# Patient Record
Sex: Female | Born: 1960 | Race: White | Hispanic: No | Marital: Married | Smoking: Never smoker
Health system: Southern US, Community
[De-identification: ages and names within clinical notes are randomized; demographics above are authoritative.]

## PROBLEM LIST (undated history)

## (undated) DIAGNOSIS — D649 Anemia, unspecified: Secondary | ICD-10-CM

## (undated) DIAGNOSIS — T7840XA Allergy, unspecified, initial encounter: Secondary | ICD-10-CM

## (undated) DIAGNOSIS — M1990 Unspecified osteoarthritis, unspecified site: Secondary | ICD-10-CM

## (undated) HISTORY — PX: COLONOSCOPY: SHX174

## (undated) HISTORY — PX: WISDOM TOOTH EXTRACTION: SHX21

## (undated) HISTORY — DX: Unspecified osteoarthritis, unspecified site: M19.90

## (undated) HISTORY — DX: Allergy, unspecified, initial encounter: T78.40XA

## (undated) HISTORY — DX: Anemia, unspecified: D64.9

---

## 1998-01-19 ENCOUNTER — Ambulatory Visit (HOSPITAL_COMMUNITY): Admission: RE | Admit: 1998-01-19 | Discharge: 1998-01-19 | Payer: Self-pay | Admitting: Obstetrics and Gynecology

## 1998-03-11 ENCOUNTER — Inpatient Hospital Stay (HOSPITAL_COMMUNITY): Admission: AD | Admit: 1998-03-11 | Discharge: 1998-03-11 | Payer: Self-pay | Admitting: Obstetrics & Gynecology

## 1998-03-30 ENCOUNTER — Other Ambulatory Visit: Admission: RE | Admit: 1998-03-30 | Discharge: 1998-03-30 | Payer: Self-pay | Admitting: Obstetrics and Gynecology

## 1998-04-14 ENCOUNTER — Inpatient Hospital Stay (HOSPITAL_COMMUNITY): Admission: AD | Admit: 1998-04-14 | Discharge: 1998-04-14 | Payer: Self-pay | Admitting: Obstetrics and Gynecology

## 1998-05-26 ENCOUNTER — Other Ambulatory Visit: Admission: RE | Admit: 1998-05-26 | Discharge: 1998-05-26 | Payer: Self-pay | Admitting: Obstetrics and Gynecology

## 1998-06-26 ENCOUNTER — Inpatient Hospital Stay (HOSPITAL_COMMUNITY): Admission: AD | Admit: 1998-06-26 | Discharge: 1998-06-28 | Payer: Self-pay | Admitting: *Deleted

## 2000-04-16 ENCOUNTER — Other Ambulatory Visit: Admission: RE | Admit: 2000-04-16 | Discharge: 2000-04-16 | Payer: Self-pay | Admitting: Obstetrics and Gynecology

## 2000-05-26 ENCOUNTER — Ambulatory Visit (HOSPITAL_COMMUNITY): Admission: RE | Admit: 2000-05-26 | Discharge: 2000-05-26 | Payer: Self-pay | Admitting: Obstetrics and Gynecology

## 2000-05-26 ENCOUNTER — Encounter: Payer: Self-pay | Admitting: Obstetrics and Gynecology

## 2000-07-25 ENCOUNTER — Encounter: Payer: Self-pay | Admitting: Obstetrics & Gynecology

## 2000-07-25 ENCOUNTER — Ambulatory Visit (HOSPITAL_COMMUNITY): Admission: RE | Admit: 2000-07-25 | Discharge: 2000-07-25 | Payer: Self-pay | Admitting: Obstetrics & Gynecology

## 2000-08-06 ENCOUNTER — Inpatient Hospital Stay (HOSPITAL_COMMUNITY): Admission: AD | Admit: 2000-08-06 | Discharge: 2000-08-06 | Payer: Self-pay | Admitting: Obstetrics & Gynecology

## 2000-09-11 ENCOUNTER — Encounter: Payer: Self-pay | Admitting: Obstetrics and Gynecology

## 2000-09-11 ENCOUNTER — Ambulatory Visit (HOSPITAL_COMMUNITY): Admission: RE | Admit: 2000-09-11 | Discharge: 2000-09-11 | Payer: Self-pay | Admitting: Obstetrics and Gynecology

## 2000-11-02 ENCOUNTER — Inpatient Hospital Stay (HOSPITAL_COMMUNITY): Admission: AD | Admit: 2000-11-02 | Discharge: 2000-11-02 | Payer: Self-pay | Admitting: *Deleted

## 2000-11-03 ENCOUNTER — Inpatient Hospital Stay (HOSPITAL_COMMUNITY): Admission: AD | Admit: 2000-11-03 | Discharge: 2000-11-05 | Payer: Self-pay | Admitting: Obstetrics & Gynecology

## 2001-09-23 ENCOUNTER — Other Ambulatory Visit: Admission: RE | Admit: 2001-09-23 | Discharge: 2001-09-23 | Payer: Self-pay | Admitting: Obstetrics and Gynecology

## 2001-10-28 ENCOUNTER — Ambulatory Visit (HOSPITAL_COMMUNITY): Admission: AD | Admit: 2001-10-28 | Discharge: 2001-10-28 | Payer: Self-pay | Admitting: Obstetrics and Gynecology

## 2001-10-28 ENCOUNTER — Encounter (INDEPENDENT_AMBULATORY_CARE_PROVIDER_SITE_OTHER): Payer: Self-pay

## 2002-08-11 ENCOUNTER — Encounter: Payer: Self-pay | Admitting: Obstetrics and Gynecology

## 2002-08-11 ENCOUNTER — Ambulatory Visit (HOSPITAL_COMMUNITY): Admission: RE | Admit: 2002-08-11 | Discharge: 2002-08-11 | Payer: Self-pay | Admitting: Obstetrics and Gynecology

## 2002-10-25 ENCOUNTER — Inpatient Hospital Stay (HOSPITAL_COMMUNITY): Admission: AD | Admit: 2002-10-25 | Discharge: 2002-10-25 | Payer: Self-pay | Admitting: Obstetrics and Gynecology

## 2002-12-30 ENCOUNTER — Ambulatory Visit (HOSPITAL_COMMUNITY): Admission: RE | Admit: 2002-12-30 | Discharge: 2002-12-30 | Payer: Self-pay | Admitting: Obstetrics and Gynecology

## 2002-12-30 ENCOUNTER — Encounter: Payer: Self-pay | Admitting: Obstetrics and Gynecology

## 2003-01-12 ENCOUNTER — Inpatient Hospital Stay (HOSPITAL_COMMUNITY): Admission: AD | Admit: 2003-01-12 | Discharge: 2003-01-15 | Payer: Self-pay | Admitting: Obstetrics and Gynecology

## 2003-02-24 ENCOUNTER — Other Ambulatory Visit: Admission: RE | Admit: 2003-02-24 | Discharge: 2003-02-24 | Payer: Self-pay | Admitting: *Deleted

## 2008-02-10 ENCOUNTER — Ambulatory Visit (HOSPITAL_COMMUNITY): Admission: RE | Admit: 2008-02-10 | Discharge: 2008-02-10 | Payer: Self-pay | Admitting: Family Medicine

## 2008-03-08 ENCOUNTER — Encounter: Admission: RE | Admit: 2008-03-08 | Discharge: 2008-03-08 | Payer: Self-pay | Admitting: Family Medicine

## 2011-04-19 NOTE — H&P (Signed)
[facility]  Patient:    Vanessa Nixon, Vanessa Nixon                       MRN: [id number] Adm. Date:  [id number] Attending:  Cleatrice Burke Dictator:   Wynelle Bourgeois, C.N.M.                         History and Physical  HISTORY OF PRESENT ILLNESS:   The patient is a 50 year old gravida 5, para 3-0-1-3, at 40-4/7 weeks who presented to the MAU this morning with complaints of increased uterine contractions and rupture of membranes at 0245.  She was previously seen in the MAU earlier in the evening, and discharged home at 11 p.m. with a cervix of 3 cm, and irregular contractions, and a reactive fetal heart tracing.  Shortly, after admission to MAU, the patient was transferred to the birthing suite where she quickly delivered vaginally of a female infant, weighing 7 pounds and 5 ounces.  Apgars 8 and 8.  Her pregnancy has been followed by the nurse midwifery service at [facility], and has been remarkable for:  1. History of irritable bowel syndrome. 2. Advanced maternal age. 3. History of postpartum depression. 4. Rh negative. 5. GBS negative.  OB HISTORY:                   Remarkable for a vacuum-assisted vaginal delivery in April 1995 of a female infant at [redacted] weeks gestation, weighing 6 pounds and 12 ounces.  She had a vaginal delivery in March of 1997, of a female infant at [redacted] weeks gestation, weighing 7 pounds and 6 ounces, and she had a spontaneous abortion in June of 1998, at 7-[redacted] weeks gestation with no complications.  She had a vacuum-assisted vaginal delivery in July of 1999, of a female infant at [redacted] weeks gestation, weighing 7 pounds and 9 ounces.  Two of these pregnancies were complicated by hip malalignment in the mother with prolonged numbness in the right foot after the third delivery.  PRENATAL LABORATORY DATA:     Hemoglobin 10.8, hematocrit 34.5, platelets 256. Blood type O-negative.  Antibody screen negative.  RPR nonreactive.   Rubella immune.  HBsAG negative.  Pap test normal.  Gonorrhea negative.  Chlamydia negative.  AFB free beta declined.  Glucose challenge 146.  Three-hour GGT had values of 76, 96, 125, and 108.  PAST MEDICAL HISTORY:         Remarkable for postpartum depression x 2 weeks after one pregnancy, occasional yeast infections, childhood rubella and Varicella, and a remote history of CMV at age 28.  She also has a history of anemia since 13 years, and history of chronic constipation and chronic gastritis.  PAST SURGICAL HISTORY:        Remarkable for child birth and wisdom tooth extraction in 1992.  FAMILY HISTORY:               Remarkable for a mother with varicosities, paternal aunt with colon cancer, sister with breast cancer, maternal grandmother with brain cancer.  GENETIC HISTORY:              Remarkable for the patients age being 30; amnio was declined.  SOCIAL HISTORY:               The patient is married to [employer] who is involved and supportive.  They are of the [place] faith.  She denies any  alcohol, tobacco, or drug use.  PHYSICAL EXAMINATION:  VITAL SIGNS:                  Stable.  She is afebrile.  HEENT:                        Within normal limits.  Thyroid normal and not enlarged.  BREASTS:                      Soft and nontender, no masses.  CHEST:                        Clear to auscultation bilaterally.  HEART:                        Regular rate and rhythm.  ABDOMEN:                      Gravid at 4 cm, vertex to Leop[place].  Fetal heart rate 140s with positive accelerations.  No decelerations until about the time of delivery in which she had variable decelerations in the 120s.  CERVICAL EXAMINATION:         On admission, the anterior lip completely dilated to 0 station with a vertex presentation.  Clear fluid leaking.  EXTREMITIES:                  Within normal limits.  ASSESSMENT:                   1. Intrauterine pregnancy at 40-4/7 weeks.                                2. Group B streptococcus negative.                               3. Status post spontaneous vaginal delivery of a                                  female infant weighing 7 pounds and 5 ounces.                                  Apgars 8 and 8.                               4. Small periclitoral split with a small                                  perineal abrasion.  PLAN:                         1. Admit to birthing suites was effective.                               2. Routine CNM care.  3. Routine delivery care.                               4. Routine postpartum orders. DD:  11/03/00 TD:  11/03/00 Job: [id number]

## 2011-04-19 NOTE — Op Note (Signed)
[facility]  Patient:    Vanessa Nixon, Vanessa Nixon Visit Number: [id number] MRN: [id number]          Service Type: DSU Location: [facility] Attending Physician:  Shaune Spittle Dictated by:   Maris Berger. Pennie Rushing, M.D. Proc. Date: 10/28/01 Admit Date:  10/28/2001                             Operative Report  PREOPERATIVE DIAGNOSIS:       Intrauterine fetal demise at 11 weeks,                               missed abortion.  POSTOPERATIVE DIAGNOSIS:      Intrauterine fetal demise at 11 weeks,                               missed abortion.  OPERATION:                    Suction dilatation and evacuation.  SURGEON:                      Vanessa P. Pennie Rushing, M.D.  ANESTHESIA:                   General orotracheal.  ESTIMATED BLOOD LOSS:         Less than 100 cc.  COMPLICATIONS:                None.  FINDINGS:                     Products of conception, large amount.  DESCRIPTION OF PROCEDURE:     The patient was taken to the operating room after appropriate identification and placed on the operating table.  After achievement of adequate general anesthesia, she was placed in the lithotomy position.  The perineum and vagina were prepped with multiple layers of Betadine and draped as a sterile field.  A red Robinson catheter was used to empty the bladder.  A Graves speculum was placed in the posterior vagina and single-tooth tenaculum placed on the anterior cervix.  A paracervical block was achieved with a total of 10 cc of 2% Xylocaine.  The cervix was noted to be dilated to accommodate a #12 suction catheter.  This was used to suction evacuate the contents of the uterus and a sharp curet used to insure that all products of conception had been removed.  All instruments were then removed from the vagina and the patient awakened from general anesthesia and taken to the recovery room in satisfactory condition having tolerated the procedure well with sponge  and instrument counts correct.  Specimens to pathology: Products of conception.  Postoperative instructions:  Printed instructions from [facility].  Discharge medications:  Ibuprofen 600 mg p.o. q.6h. p.r.n. pain, over-the-counter.  Methergine 0.2 mg p.o. q.6h. for eight doses.  Doxycycline 100 mg p.o. b.i.d. for five days.  Blood type is O negative and the patient is given RhoGAM.  Follow-up instructions in two weeks with Dr. Pennie Rushing. Dictated by:   Maris Berger. Pennie Rushing, M.D. Attending Physician:  Shaune Spittle DD:  10/28/01 TD:  10/28/01 Job: [id number]

## 2011-04-19 NOTE — Op Note (Signed)
NAME:  Vanessa Nixon, Vanessa Nixon                          ACCOUNT NO.:  [id number]   MEDICAL RECORD NO.:  [id number]                   PATIENT TYPE:  INP   LOCATION:  9198                                 FACILITY:  [facility]   PHYSICIAN:  Hal Morales, M.D.             DATE OF BIRTH:  May 27, 1961   DATE OF PROCEDURE:  01/12/2003  DATE OF DISCHARGE:                                 OPERATIVE REPORT   PREOPERATIVE DIAGNOSES:  1. Intrauterine pregnancy at term.  2. Abnormal and unstable fetal lie.   POSTOPERATIVE DIAGNOSIS:  Term intrauterine pregnancy with transverse lie.   PROCEDURE:  Primary low transverse cesarean section.   SURGEON:  Hal Morales, M.D.   ASSISTANT:  Rica Koyanagi, C.N.M.   ANESTHESIA:  Spinal and local for incision closure.   ESTIMATED BLOOD LOSS:  1000 mL.   COMPLICATIONS:  None.   FINDINGS:  The uterus, tubes, and ovaries were normal for the gravid state.  The patient was delivered of a female infant whose name is Vicente Serene, weighing 8  pounds 3 ounces, with Apgars of 8 and 9 at one and five minutes,  respectively.   DESCRIPTION OF PROCEDURE:  The patient was taken to the operating room after  appropriate identification and placed on the operating table.  A limited  ultrasound was performed in the preoperative area showing the fetal vertex  in the right upper quadrant.  A spinal anesthetic was undertaken in the  operating room, and the patient was placed in the supine position with a  left lateral tilt.  The abdomen and perineum were prepped with multiple  layers of Betadine and a Foley catheter inserted into the bladder and  connected to straight drainage.  The abdomen was draped as a sterile field.  After assurance of adequate anesthesia, a transverse incision was made in  the abdomen nondistended the abdomen opened in layers.  The peritoneum was  opened in layers.  The uterus was incised approximately 2 cm above the  uterovesical fold and the incision  taken laterally on either side bluntly.  The infant was rotated into the frank breech position and delivered as a  frank breech.  After having the nares and pharynx suctioned and the cord  clamped and cut, he was handed off to the awaiting pediatricians.  The  appropriate cord blood was drawn and the placenta noted to have separated  from the uterus and was removed from the operative field.  The uterine  incision was closed with a running interlocking suture of 0 Vicryl.  An  imbricating suture of 0 Vicryl was then placed.  Hemostasis was achieved  with a single figure-of-eight suture in the incision line and copious  irrigation carried out.  The abdominal peritoneum was closed with a running  suture of 2-0 Vicryl.  The rectus muscles were reapproximated in the midline  with a figure-of-eight suture of 2-0 Vicryl.  The rectus fascia was closed  with a running suture of 0 Vicryl, then reinforced on either side of midline  with figure-of-eight sutures of 0 Vicryl.  The subcutaneous tissue was  irrigated and made hemostatic with Bovie cautery, then infiltrated with a  total of 10 mL of  0.25% Marcaine.  Skin staples were applied to the skin incision.  A sterile  dressing was applied and the patient taken from the operating room to the  recovery room in satisfactory condition, having tolerated the procedure  well, with sponge and instrument counts correct.  The infant went to the  full-term nursery.                                                Hal Morales, M.D.    VPH/MEDQ  D:  01/12/2003  T:  01/12/2003  Job:  [id number]

## 2011-04-19 NOTE — Discharge Summary (Signed)
NAME:  Vanessa Nixon, Vanessa Nixon                          ACCOUNT NO.:  [id number]   MEDICAL RECORD NO.:  [id number]                   PATIENT TYPE:  INP   LOCATION:  9144                                 [facility]ACILITY:  [facility]   PHYSICIAN:  Crist Fat. Rivard, M.D.              DATE OF BIRTH:  [birth year]   DATE OF ADMISSION:  01/12/2003  DATE OF DISCHARGE:  01/15/2003                                 DISCHARGE SUMMARY   ADMISSION DIAGNOSES:  1. Intrauterine pregnancy at term.  2. Abnormal and unstable fetal lie.   DISCHARGE DIAGNOSES:  1. Intrauterine pregnancy at term.  2. Abnormal and unstable fetal lie.   PROCEDURE:  Primary low transverse cesarean section, spinal and local for  incision closure.   HOSPITAL COURSE:  The patient is a 50 year old married white female gravida  7, para 4-0-2-4 who presents at [redacted] weeks gestation with a history over the  last few weeks of fetal malpresentation.  Ultrasound on December 30, 2002  found baby to be in footling breech presentation with a nuchal cord noted at  that time.  The patient spoke with Erie Noe P. Haygood, M.D. regarding the  risks and benefits of cesarean section as well as breech delivery.  Repeat  ultrasound on January 11, 2003 showed the baby in transverse lie and  cesarean section was scheduled for January 12, 2003.  Her pregnancy had  been followed by the Certified Nurse Midwife Service at [facility] and had been remarkable for:  (1) history of irritable bowel  syndrome, (2) history of depression, (3) Rh negative, (4) history of  sciatica pain lower thigh secondary pregnancy less than 12 months.  The  procedure went well with the delivery of a viable female infant named Gabriel  weighing 8 pounds 3 ounces with Apgars of 8 and 9 at one and five minutes  respectively.  Infant was taken to the full-term nursery in good condition.  The patient was taken to the recovery room and was doing well.  Infant was  delivered from a frank  breech position.  By postop day #1, the patient was  doing well.  Her hemoglobin was 7.2, it had been 10.5 preoperatively.  She  was asymptomatic and her vital signs were stable.  She planned condoms and  foam for contraception and then her husband is to have a vasectomy.  By  postop day #2, there were some issues with her pain medication.  Percocet  was ineffective and Motrin was causing GI upset.  She was started on  Darvocet, which by postop day #3 she reported as a good source of pain  relief.  By postop day #3, she continued to do well and was deemed to have  received the full benefit of her hospital stay and was discharged home.   DISCHARGE MEDICATIONS:  1. Darvocet-N 100 one to two p.o. q.3-4 h. p.r.n. pain.  2. Tylenol  650 mg p.o. q.4 h. p.r.n. pain.  3. Prenatal vitamins one p.o. daily.   DISCHARGE INSTRUCTIONS:  Per [facility] handout.   FOLLOW UP:  Discharge follow up will occur at [facility] in six  weeks postpartum.    Cam Hai, C.N.M.                     Crist Fat Rivard, M.D.   KS/MEDQ  D:  01/15/2003  T:  01/15/2003  Job:  [id number]

## 2011-04-19 NOTE — H&P (Signed)
NAMEGINIA, Vanessa Nixon                            ACCOUNT NO.:  [id number]   MEDICAL RECORD NO.:  [id number]                   PATIENT TYPE:   LOCATION:                                       FACILITY:   PHYSICIAN:  Hal Morales, M.D.             DATE OF BIRTH:  11-Dec-1960   DATE OF ADMISSION:  DATE OF DISCHARGE:                                HISTORY & PHYSICAL   HISTORY OF PRESENT ILLNESS:  Vanessa Nixon is a 50 year old Gravida VII, Para  IV, 0/2/4 with estimated date of delivery January 12, 2003 determined by 14  week, 0 day ultrasound and confirmed with follow-up ultrasound. The patient  is scheduled for a primary low transverse Cesarean section on January 12, 2003 for mal-presentation. Ultrasound performed on December 30, 2002 finds  baby to be in the footling breech presentation and also a nuchal cord is  noted at that time. As these are contraindications to external cephalic  version, this was not offered to the patient. The risks and benefits of  Cesarean section as well as breech delivery were discussed by Dr. Dierdre Forth with the patient and the patient consented to a scheduled primary  low transverse Cesarean section. She will have a repeat ultrasound on  January 11, 2003 and if baby remains breech, Cesarean section will be  planned for tomorrow. This patient's pregnancy has been followed by the CNM  Service at [facility]. It is remarkable for advanced maternal age. She declined  amniocentesis. She has a past history of irritable bowel syndrome, history  of depression, RH negative, history of sciatica pain, second pregnancy in  less than 12 months. The patient is group B strep negative.   OBSTETRIC HISTORY:  In 1995, the patient had a vacuum assisted vaginal  delivery at term with the birth of a 6 pound 12 ounce female infant named  Reuel Boom with no complications. In 1997, the patient had a normal spontaneous  vaginal delivery at term for a 7 pound 6 ounce female infant  with no  complications. The baby's name is Tobi Bastos. In 1998, the patient had a first  trimester spontaneous AB with no complications. In 1999, at term a vacuum  assisted vaginal birth for a 7 pound 9 ounce female infant named Surveyor, mining. The  patient experienced prolonged numbness in her right foot from the epidural  and postpartum depression following that pregnancy. In 2001 at term, the  patient had a normal spontaneous vaginal delivery with the birth of a 7  pound 5 ounce female infant and in 2002, the patient had a first trimester  missed AB with a D&E and the present pregnancy.   PAST MEDICAL HISTORY:  The patient's medical history is significant for  postpartum depression. She has a history of irritable bowel syndrome and had  her wisdom teeth removed in 1992. She also experiences periodic anemia.   FAMILY HISTORY:  The patient's mother and maternal aunt with varicose veins.  Paternal aunt history of colon cancer. Patient's sister with breast cancer  and mother with a brain tumor.   GENETIC HISTORY:  Significant for advanced maternal age, with patient being  50 years of age. Declined amniocentesis.   SOCIAL HISTORY:  Vanessa Nixon is a 50 year old married Caucasian female. Her  husband, Oakley Orban, is involved in supportive. They are [place] in  their faith. She denies the use of alcohol, tobacco, or illicit drugs.   ALLERGIES:  CODEINE (nausea and vomiting).   REVIEW OF SYSTEMS:  As described above. The patient is typical of one with a  uterine pregnancy at term.   PHYSICAL EXAMINATION:  VITAL SIGNS: Stable. Afebrile.  HEENT: Unremarkable.  HEART: Regular rate and rhythm.  LUNGS: Clear.  ABDOMEN: Gravid and is contour. Uterine fundus is noted to extend 38 cm  above the level of the pubic synthesis.  EXTREMITIES: No pathologic edema. Deep tendon reflexes are 1+ with no  clonus.   DIAGNOSTIC IMPRESSION:  Ultrasound examination on December 30, 2002 finds the  infant to be in a  footling breech position with the presence of a nuchal  cord. Amniotic fluid level at that time is normal. Fetal heart rate is  reassuring with Doppler at 140.   ASSESSMENT:  Intrauterine pregnancy at term. Breech presentation.   PLAN:  Admit for Dr. Dierdre Forth. Primary low transverse Cesarean  section for breech presentation.     Rica Koyanagi, C.N.M.               Hal Morales, M.D.    SDM/MEDQ  D:  01/11/2003  T:  01/11/2003  Job:  [id number]   cc:   Hal Morales, M.D.  [address], [address]  [place]  [place] [address]  Fax: [phone]

## 2013-04-12 ENCOUNTER — Other Ambulatory Visit: Payer: Self-pay | Admitting: Family Medicine

## 2013-04-12 ENCOUNTER — Other Ambulatory Visit (HOSPITAL_COMMUNITY)
Admission: RE | Admit: 2013-04-12 | Discharge: 2013-04-12 | Disposition: A | Discharge status: Home / Self Care | Payer: BC Managed Care – HMO | Source: Ambulatory Visit | Attending: Family Medicine | Admitting: Family Medicine

## 2013-04-12 DIAGNOSIS — Z124 Encounter for screening for malignant neoplasm of cervix: Secondary | ICD-10-CM | POA: Insufficient documentation

## 2016-01-29 ENCOUNTER — Ambulatory Visit (INDEPENDENT_AMBULATORY_CARE_PROVIDER_SITE_OTHER): Payer: 59 | Admitting: Family Medicine

## 2016-01-29 VITALS — BP 134/72 | HR 91 | Temp 98.5°F | Resp 16 | Ht 61.0 in | Wt 141.0 lb

## 2016-01-29 DIAGNOSIS — G4700 Insomnia, unspecified: Secondary | ICD-10-CM

## 2016-01-29 DIAGNOSIS — L853 Xerosis cutis: Secondary | ICD-10-CM

## 2016-01-29 DIAGNOSIS — L219 Seborrheic dermatitis, unspecified: Secondary | ICD-10-CM

## 2016-01-29 MED ORDER — FLUOCINOLONE ACETONIDE 0.01 % EX SHAM: MEDICATED_SHAMPOO | CUTANEOUS | Status: DC

## 2016-01-29 NOTE — Patient Instructions (Signed)
Seborrheic Dermatitis Seborrheic dermatitis involves pink or red skin with greasy, flaky scales. It usually occurs on the scalp, and it is often called dandruff. This condition may also affect the eyebrows, nose, ears, chest, and the bearded area of men's faces. It often occurs where skin has more oil (sebaceous) glands. It may come and go for no known reason, and it is often long-lasting (chronic). CAUSES The cause is not known. RISK FACTORS This condition is more like to develop in:  People who are stressed or tired.  People who have skin conditions, such as acne.  People who have certain conditions, such as:  HIV (human immunodeficiency virus).  AIDS (acquired immunodeficiency syndrome).  Parkinson disease.  An eating disorder.  Stroke.  Depression.  Epilepsy.  Alcoholism.  People who live in places that have extreme weather.  People who have a family history of seborrheic dermatitis.  People who use skin creams that are made with alcohol.  People who are 30-60 years old.  People who take certain medicines. SYMPTOMS Symptoms of this condition include:  Thick scales on the scalp.  Redness on the face or in the armpits.  Skin that is flaky. The flakes may be white or yellow.  Skin that seems oily or dry but is not helped with moisturizers.  Itching or burning in the affected areas. DIAGNOSIS This condition is diagnosed with a medical history and physical exam. A sample of your skin may be tested (skin biopsy). You may need to see a skin specialist (dermatologist). TREATMENT There is no cure for this condition, but treatment can help to manage the symptoms. Treatment may include:  Cortisone (steroid) ointments, creams, and lotions.  Over-the-counter or prescription shampoos. HOME CARE INSTRUCTIONS  Apply over-the-counter and prescription medicines only as told by your health care provider.  Keep all follow-up visits as told by your health care provider.  This is important.  Try to reduce your stress, such as with yoga or mediation. If you need help to reduce stress, ask your health care provider.  Shower or bathe as told by your health care provider.  Use any medicated shampoos as told by your health care provider. SEEK MEDICAL CARE IF:  Your symptoms do not improve with treatment.  Your symptoms get worse.  You have new symptoms.   This information is not intended to replace advice given to you by your health care provider. Make sure you discuss any questions you have with your health care provider.   Document Released: 11/18/2005 Document Revised: 08/09/2015 Document Reviewed: 04/05/2015 Elsevier Interactive Patient Education 2016 [employer]  

## 2016-01-29 NOTE — Progress Notes (Signed)
 Chief Complaint:  Chief Complaint  Patient presents with  . Insomnia  . scalp itching    HPI: Vanessa Nixon is a 55 y.o. female who reports to [facility] today complaining of  1-2 week history of scalp itching, no new meds.  History of eczema, sensitive skin, has not tried anything. . She has no pain or numbness.  No bumps or lumps , Just itches. . No  Hx of psoriasis. Daughter has eczema on her hands  She is still having her cycle. She is turning 55 and wants to know if that is noral for her to have regular cycles. Has difficulty sleeping. Falling and staying asleep.   No past medical history on file. No past surgical history on file. Social History   Social History  . Marital Status: Married    Spouse Name: N/A  . Number of Children: N/A  . Years of Education: N/A   Social History Main Topics  . Smoking status: Never Smoker   . Smokeless tobacco: None  . Alcohol Use: None  . Drug Use: None  . Sexual Activity: Not Asked   Other Topics Concern  . None   Social History Narrative  . None   No family history on file. Allergies  Allergen Reactions  . Codeine Itching   Prior to Admission medications   Medication Sig Start Date End Date Taking? Authorizing Provider  Multiple Vitamins-Minerals (MULTIVITAMIN WITH MINERALS) tablet Take 1 tablet by mouth daily.   Yes Historical Provider, MD     ROS: The patient denies fevers, chills, night sweats, unintentional weight loss, chest pain, palpitations, wheezing, dyspnea on exertion, nausea, vomiting, abdominal pain, dysuria, hematuria, melena, numbness, weakness, or tingling.  All other systems have been reviewed and were otherwise negative with the exception of those mentioned in the HPI and as above.    PHYSICAL EXAM: Filed Vitals:   01/29/16 1710  BP: 134/72  Pulse: 91  Temp: 98.5 F (36.9 C)  Resp: 16   Body mass index is 26.66 kg/(m^2).   General: Alert, no acute distress HEENT:  Normocephalic,  atraumatic, oropharynx patent. EOMI, PERRLA, no thryoidmegaly Cardiovascular:  Regular rate and rhythm, no rubs murmurs or gallops.  No Carotid bruits, radial pulse intact. No pedal edema.  Respiratory: Clear to auscultation bilaterally.  No wheezes, rales, or rhonchi.  No cyanosis, no use of accessory musculature Abdominal: No organomegaly, abdomen is soft and non-tender, positive bowel sounds. No masses. Skin: ? seborrhiec dermatitis Neurologic: Facial musculature symmetric. Psychiatric: Patient acts appropriately throughout our interaction. Lymphatic: No cervical or submandibular lymphadenopathy Musculoskeletal: Gait intact. No edema, tenderness   LABS: Results for orders placed or performed in visit on 01/29/16  COMPLETE METABOLIC PANEL WITH GFR  Result Value Ref Range   Sodium 137 135 - 146 mmol/L   Potassium 4.2 3.5 - 5.3 mmol/L   Chloride 101 98 - 110 mmol/L   CO2 26 20 - 31 mmol/L   Glucose, Bld 82 65 - 99 mg/dL   BUN 13 7 - 25 mg/dL   Creat 0.68 0.50 - 1.05 mg/dL   Total Bilirubin 0.3 0.2 - 1.2 mg/dL   Alkaline Phosphatase 70 33 - 130 U/L   AST 16 10 - 35 U/L   ALT 11 6 - 29 U/L   Total Protein 7.4 6.1 - 8.1 g/dL   Albumin 4.3 3.6 - 5.1 g/dL   Calcium 9.6 8.6 - 10.4 mg/dL   GFR, Est African American >89 >=60 mL/min  GFR, Est Non African American >89 >=60 mL/min  CBC  Result Value Ref Range   WBC 13.3 (H) 4.0 - 10.5 K/uL   RBC 5.02 3.87 - 5.11 MIL/uL   Hemoglobin 10.8 (L) 12.0 - 15.0 g/dL   HCT 33.8 (L) 36.0 - 46.0 %   MCV 67.3 (L) 78.0 - 100.0 fL   MCH 21.5 (L) 26.0 - 34.0 pg   MCHC 32.0 30.0 - 36.0 g/dL   RDW 15.9 (H) 11.5 - 15.5 %   Platelets 253 150 - 400 K/uL   MPV Not Performed 8.6 - 12.4 fL  TSH  Result Value Ref Range   TSH 3.91 mIU/L     EKG/XRAY:   Primary read interpreted by Dr. Marin Comment at [facility].   ASSESSMENT/PLAN: Encounter Diagnoses  Name Primary?  . Seborrhea Yes  . Dry skin   . Insomnia    Will give steroid shampoo for scalp , she wants it  gone Other option is ketoconazole shampoo but she would like to try steroid  Shampoo Labs pednig, consider ambien trial or trazadone trial if TSH normal if insomnia does not improve Consider tylenol pm or melatonin for insomnia prn  Fu prn   Gross sideeffects, risk and benefits, and alternatives of medications d/w patient. Patient is aware that all medications have potential sideeffects and we are unable to predict every sideeffect or drug-drug interaction that may occur.    DO  02/13/2016 11:23 PM

## 2016-01-30 ENCOUNTER — Telehealth: Payer: Self-pay

## 2016-01-30 LAB — COMPLETE METABOLIC PANEL WITHOUT GFR
ALT: 11 U/L (ref 6–29)
AST: 16 U/L (ref 10–35)
Albumin: 4.3 g/dL (ref 3.6–5.1)
Alkaline Phosphatase: 70 U/L (ref 33–130)
BUN: 13 mg/dL (ref 7–25)
CO2: 26 mmol/L (ref 20–31)
Creat: 0.68 mg/dL (ref 0.50–1.05)
GFR, Est Non African American: >89 mL/min (ref >=60)
Glucose, Bld: 82 mg/dL (ref 65–99)
Total Bilirubin: 0.3 mg/dL (ref 0.2–1.2)
Total Protein: 7.4 g/dL (ref 6.1–8.1)

## 2016-01-30 LAB — COMPLETE METABOLIC PANEL WITH GFR
Calcium: 9.6 mg/dL (ref 8.6–10.4)
Chloride: 101 mmol/L (ref 98–110)
GFR, Est African American: >89 mL/min (ref >=60)
Potassium: 4.2 mmol/L (ref 3.5–5.3)
Sodium: 137 mmol/L (ref 135–146)

## 2016-01-30 LAB — CBC
HCT: 33.8 % — ABNORMAL LOW (ref 36.0–46.0)
Hemoglobin: 10.8 g/dL — ABNORMAL LOW (ref 12.0–15.0)
MCH: 21.5 pg — ABNORMAL LOW (ref 26.0–34.0)
MCHC: 32 g/dL (ref 30.0–36.0)
MCV: 67.3 fL — ABNORMAL LOW (ref 78.0–100.0)
Platelets: 253 10*3/uL (ref 150–400)
RBC: 5.02 MIL/uL (ref 3.87–5.11)
RDW: 15.9 % — ABNORMAL HIGH (ref 11.5–15.5)
WBC: 13.3 10*3/uL — ABNORMAL HIGH (ref 4.0–10.5)

## 2016-01-30 LAB — TSH: TSH: 3.91 m[IU]/L

## 2016-01-30 NOTE — Telephone Encounter (Signed)
Pharm called to clarify Rx for Fluocinolone Acetonide 0.01 % SHAM. They want to know if Dr Marin Comment wants them to fill this with Cap X or Derma Smooth Scalp Oil?

## 2016-01-31 NOTE — Telephone Encounter (Signed)
Can you please let pharmacy know I prefer the capex, also let Melia know that she should try it on a small part of her skin first in some place discrete to test if she ahs any sensitivities before she nputs it all over her scalp.   Thanks!!!!

## 2016-02-02 NOTE — Telephone Encounter (Signed)
Unable to leave voicemail about labs, number disconnected

## 2016-02-05 NOTE — Telephone Encounter (Signed)
Called pharm and advised to pls fill with Cap X. Also had them put a note on Rx to tell pt to try in small place first to check for adverse reaction bf using it on entire scalp. Tried to call pt again, and phone still [place].

## 2016-02-14 ENCOUNTER — Encounter: Payer: Self-pay | Admitting: Family Medicine

## 2016-03-11 DIAGNOSIS — H5213 Myopia, bilateral: Secondary | ICD-10-CM | POA: Diagnosis not present

## 2017-01-22 ENCOUNTER — Ambulatory Visit (INDEPENDENT_AMBULATORY_CARE_PROVIDER_SITE_OTHER): Payer: 59

## 2017-01-22 ENCOUNTER — Ambulatory Visit (INDEPENDENT_AMBULATORY_CARE_PROVIDER_SITE_OTHER): Payer: 59 | Admitting: Family Medicine

## 2017-01-22 ENCOUNTER — Encounter: Payer: Self-pay | Admitting: Family Medicine

## 2017-01-22 VITALS — BP 145/81 | HR 88 | Temp 98.7°F | Resp 16 | Ht 61.0 in | Wt 142.6 lb

## 2017-01-22 DIAGNOSIS — S6991XA Unspecified injury of right wrist, hand and finger(s), initial encounter: Secondary | ICD-10-CM | POA: Diagnosis not present

## 2017-01-22 DIAGNOSIS — M778 Other enthesopathies, not elsewhere classified: Secondary | ICD-10-CM

## 2017-01-22 DIAGNOSIS — M779 Enthesopathy, unspecified: Secondary | ICD-10-CM

## 2017-01-22 NOTE — Progress Notes (Signed)
  Chief Complaint  Patient presents with  . Finger Injury    X2 weeks; was bowling and injured finger; right ring finger    HPI   Pt reports that 2 weeks ago Vanessa Nixon was bowling and the holes in the ball were too small Vanessa Nixon states that Vanessa Nixon started having pain in the right ring finger with mild swelling that has been present over the past 2 weeks Vanessa Nixon denies any loss of range of motion Vanessa Nixon reports that it hurts but there is not discoloration. Most of her pain is when Vanessa Nixon lays her hands flat on a surface  History reviewed. No pertinent past medical history.  Current Outpatient Prescriptions  Medication Sig Dispense Refill  . Multiple Vitamins-Minerals (MULTIVITAMIN WITH MINERALS) tablet Take 1 tablet by mouth daily.     No current facility-administered medications for this visit.     Allergies:  Allergies  Allergen Reactions  . Codeine Itching    History reviewed. No pertinent surgical history.  Social History   Social History  . Marital status: Married    Spouse name: N/A  . Number of children: N/A  . Years of education: N/A   Social History Main Topics  . Smoking status: Never Smoker  . Smokeless tobacco: Never Used  . Alcohol use None  . Drug use: Unknown  . Sexual activity: Not Asked   Other Topics Concern  . None   Social History Narrative  . None    ROS  Objective: Vitals:   01/22/17 1316  BP: (!) 145/81  Pulse: 88  Resp: 16  Temp: 98.7 F (37.1 C)  TempSrc: Oral  SpO2: 98%  Weight: 142 lb 9.6 oz (64.7 kg)  Height: 5\' 1"  (1.549 m)    Physical Exam  Constitutional: Vanessa Nixon appears well-developed and well-nourished.  HENT:  Head: Normocephalic and atraumatic.    Tenderness to the lateral sides of the 4 digit  The PIP joint shows tenderness with palpation but non tender with flexion  Cap refill <2s  CLINICAL DATA:  56 year old female with a history of sports injury of the finger  EXAM: RIGHT RING FINGER 2+V  COMPARISON:   None.  FINDINGS: No acute fracture line identified. No dislocation or subluxation. Minimal degenerative changes of the visualized interphalangeal joints with joint space narrowing. No focal soft tissue swelling. No radiopaque foreign body.  IMPRESSION: Negative for acute bony abnormality   Electronically Signed   By: Corrie Mckusick D.O.   On: 01/22/2017 13:49  Assessment and Plan [name] was seen today for finger injury.  Diagnoses and all orders for this visit:  Finger injury, right, initial encounter -     DG Finger Ring Right  Tendinitis of flexor tendon of right hand - Advised nsaids -  Typically 4-6 weeks required for healing    Vanessa Nixon Service A Nolon Rod

## 2017-01-22 NOTE — Patient Instructions (Addendum)
Tendinitis Tendinitis is inflammation of a tendon. A tendon is a strong cord of tissue that connects muscle to bone. Tendinitis can affect any tendon, but it most commonly affects the shoulder tendon (rotator cuff), ankle tendon (Achilles tendon), elbow tendon (triceps tendon), or one of the tendons in the wrist. What are the causes? This condition may be caused by:  Overusing a tendon or muscle. This is common.  Age-related wear and tear.  Injury.  Inflammatory conditions, such as arthritis.  Certain medicines. What increases the risk? This condition is more likely to develop in people who do activities that involve repetitive motions. What are the signs or symptoms? Symptoms of this condition may include:  Pain.  Tenderness.  Mild swelling. How is this diagnosed? This condition is diagnosed with a physical exam. You may also have tests, such as:  Ultrasound. This uses sound waves to make an image of your affected area.  MRI. How is this treated? This condition may be treated by resting, icing, applying pressure (compression), and raising (elevating) the area above the level of your heart. This is known as RICE therapy. Treatment may also include:  Medicines to help reduce inflammation or to help reduce pain.  Exercises or physical therapy to strengthen and stretch the tendon.  A brace or splint.  Surgery (rare). Follow these instructions at home:   If you have a splint or brace:   Wear the splint or brace as told by your health care provider. Remove it only as told by your health care provider.  Loosen the splint or brace if your fingers or toes tingle, become numb, or turn cold and blue.  Do not take baths, swim, or use a hot tub until your health care provider approves. Ask your health care provider if you can take showers. You may only be allowed to take sponge baths for bathing.  Do not let your splint or brace get wet if it is not waterproof.  If your splint  or brace is not waterproof, cover it with a watertight plastic bag when you take a bath or a shower.  Keep the splint or brace clean. Managing pain, stiffness, and swelling   If directed, apply ice to the affected area.  Put ice in a plastic bag.  Place a towel between your skin and the bag.  Leave the ice on for 20 minutes, 2-3 times a day.  If directed, apply heat to the affected area as often as told by your health care provider. Use the heat source that your health care provider recommends, such as a moist heat pack or a heating pad.  Place a towel between your skin and the heat source.  Leave the heat on for 20-30 minutes.  Remove the heat if your skin turns bright red. This is especially important if you are unable to feel pain, heat, or cold. You may have a greater risk of getting burned.  Move the fingers or toes of the affected limb often, if this applies. This can help to prevent stiffness and lessen swelling.  If directed, elevate the affected area above the level of your heart while you are sitting or lying down. Driving   Do not drive or operate heavy machinery while taking prescription pain medicine.  Ask your health care provider when it is safe to drive if you have a splint or brace on any part of your arm or leg. Activity   Return to your normal activities as told by your health   care provider. Ask your health care provider what activities are safe for you.  Rest the affected area as told by your health care provider.  Avoid using the affected area while you are experiencing symptoms of tendinitis.  Do exercises as told by your health care provider. General instructions   If you have a splint, do not put pressure on any part of the splint until it is fully hardened. This may take several hours.  Wear an elastic bandage or compression wrap only as told by your health care provider.  Take over-the-counter and prescription medicines only as told by your health  care provider.  Keep all follow-up visits as told by your health care provider. This is important. Contact a health care provider if:  Your symptoms do not improve.  You develop new, unexplained problems, such as numbness in your hands. This information is not intended to replace advice given to you by your health care provider. Make sure you discuss any questions you have with your health care provider. Document Released: 11/15/2000 Document Revised: 07/18/2016 Document Reviewed: 08/21/2015 Elsevier Interactive Patient Education  2017 Elsevier Inc.  

## 2017-01-30 ENCOUNTER — Encounter: Payer: Self-pay | Admitting: Family Medicine

## 2017-01-30 ENCOUNTER — Ambulatory Visit (INDEPENDENT_AMBULATORY_CARE_PROVIDER_SITE_OTHER): Payer: 59 | Admitting: Family Medicine

## 2017-01-30 VITALS — BP 153/78 | HR 99 | Temp 98.3°F | Wt 140.0 lb

## 2017-01-30 DIAGNOSIS — R399 Unspecified symptoms and signs involving the genitourinary system: Secondary | ICD-10-CM | POA: Diagnosis not present

## 2017-01-30 DIAGNOSIS — N390 Urinary tract infection, site not specified: Secondary | ICD-10-CM

## 2017-01-30 MED ORDER — AMOXICILLIN-POT CLAVULANATE 875-125 MG PO TABS: 1.0000 | ORAL_TABLET | Freq: Two times a day (BID) | ORAL | 0 refills | Status: DC

## 2017-01-30 NOTE — Progress Notes (Signed)
   BP (!) 153/78   Pulse 99   Temp 98.3 F (36.8 C)   Wt 140 lb (63.5 kg)   SpO2 99%   BMI 26.45 kg/m    Subjective:    Patient ID: Vanessa Nixon, female    DOB: [birth year], 56 y.o.   MRN: [id number]  HPI: Vanessa Nixon is a 56 y.o. female  Chief Complaint  Patient presents with  . Urinary Tract Infection    x 1 day. Dysuria, Burning   Dysuria, frequency, urgency x 1 day. Denies fever, chills, N/V, back pain. Has not tried anything OTC at this time. No hx of UTIs noted.  Relevant past medical, surgical, family and social history reviewed and updated as indicated. Interim medical history since our last visit reviewed. Allergies and medications reviewed and updated.  Review of Systems  Constitutional: Negative.   HENT: Negative.   Eyes: Negative.   Respiratory: Negative.   Cardiovascular: Negative.   Gastrointestinal: Negative.   Genitourinary: Positive for dysuria, frequency and urgency.  Musculoskeletal: Negative.   Neurological: Negative.   Psychiatric/Behavioral: Negative.     Per HPI unless specifically indicated above     Objective:    BP (!) 153/78   Pulse 99   Temp 98.3 F (36.8 C)   Wt 140 lb (63.5 kg)   SpO2 99%   BMI 26.45 kg/m   Wt Readings from Last 3 Encounters:  01/30/17 140 lb (63.5 kg)  01/22/17 142 lb 9.6 oz (64.7 kg)  01/29/16 141 lb (64 kg)    Physical Exam  Constitutional: She appears well-developed and well-nourished. No distress.  HENT:  Head: Atraumatic.  Eyes: Conjunctivae are normal. Pupils are equal, round, and reactive to light.  Neck: Normal range of motion. Neck supple.  Cardiovascular: Normal rate, regular rhythm and normal heart sounds.   Pulmonary/Chest: Effort normal. No respiratory distress.  Abdominal: Soft. Bowel sounds are normal. She exhibits no distension. There is no tenderness.  Musculoskeletal: Normal range of motion.  No CVA tenderness  Skin: Skin is warm and dry.  Psychiatric: She has a normal mood and  affect. Her behavior is normal.  Nursing note and vitals reviewed.     Assessment & Plan:   Problem List Items Addressed This Visit    None    Visit Diagnoses    Acute lower UTI    -  Primary   Will start augmentin and await cx (patient preference d/t poor abx tolerance). Push fluids, start probiotic. Follow up if worsening or no improvement.   Relevant Orders   UA/M w/rflx Culture, Routine (STAT)       Follow up plan: Return if symptoms worsen or fail to improve.

## 2017-01-30 NOTE — Patient Instructions (Signed)
Follow up as needed

## 2017-02-03 ENCOUNTER — Encounter: Payer: Self-pay | Admitting: Family Medicine

## 2017-02-03 LAB — MICROSCOPIC EXAMINATION: RBC MICROSCOPIC, UA: NONE SEEN /HPF (ref 0–?)

## 2017-02-03 LAB — UA/M W/RFLX CULTURE, ROUTINE
Bilirubin, UA: NEGATIVE
GLUCOSE, UA: NEGATIVE
Ketones, UA: NEGATIVE
NITRITE UA: NEGATIVE
Protein, UA: NEGATIVE
SPEC GRAV UA: 1.01 (ref 1.005–1.030)
Urobilinogen, Ur: 0.2 mg/dL (ref 0.2–1.0)
pH, UA: 5.5 (ref 5.0–7.5)

## 2017-02-03 LAB — URINE CULTURE, REFLEX

## 2017-03-15 DIAGNOSIS — H5213 Myopia, bilateral: Secondary | ICD-10-CM | POA: Diagnosis not present

## 2017-04-23 ENCOUNTER — Ambulatory Visit (INDEPENDENT_AMBULATORY_CARE_PROVIDER_SITE_OTHER): Payer: 59 | Admitting: Physician Assistant

## 2017-04-23 ENCOUNTER — Encounter: Payer: Self-pay | Admitting: Physician Assistant

## 2017-04-23 VITALS — BP 147/79 | HR 109 | Temp 98.6°F | Resp 18 | Ht 61.0 in | Wt 141.6 lb

## 2017-04-23 DIAGNOSIS — J029 Acute pharyngitis, unspecified: Secondary | ICD-10-CM

## 2017-04-23 LAB — POCT RAPID STREP A (OFFICE): Rapid Strep A Screen: NEGATIVE

## 2017-04-23 MED ORDER — AMOXICILLIN 500 MG PO CAPS: 500.0000 mg | ORAL_CAPSULE | Freq: Two times a day (BID) | ORAL | 0 refills | Status: DC

## 2017-04-23 NOTE — Progress Notes (Signed)
MRN: [id number] DOB: 06/23/1961  Subjective:   Vanessa Nixon is a 56 y.o. female presenting for chief complaint of Sore Throat (x 1 day); Headache; and Generalized Body Aches .  Reports 2 day history of sore throat, myalgia, ear fullness, headache, fatigue. Has tried OTC ibuprofen with mild relief. Denies fever, sinus congestion, cough, nausea, vomiting, abdominal pain and diarrhea. Has  had sick contact with daughter, who just got diagnosed with strep throat yesterday. Has mild history of seasonal allergies.Denies smoking. Denies any other aggravating or relieving factors, no other questions or concerns.  Vanessa Nixon has a current medication list which includes the following prescription(s): multivitamin with minerals and amoxicillin. Also is allergic to codeine.  Vanessa Nixon  has no past medical history on file. Also  has no past surgical history on file.   Objective:   Vitals: BP (!) 147/79   Pulse (!) 109   Temp 98.6 F (37 C) (Oral)   Resp 18   Ht 5\' 1"  (1.549 m)   Wt 141 lb 9.6 oz (64.2 kg)   LMP 04/23/2017   SpO2 98%   BMI 26.76 kg/m   Physical Exam  Constitutional: She is oriented to person, place, and time. She appears well-developed and well-nourished. She appears distressed (appears uncomfortable sitting on exam table ).  HENT:  Head: Normocephalic and atraumatic.  Right Ear: External ear and ear canal normal. A middle ear effusion (mild) is present.  Left Ear: External ear and ear canal normal. A middle ear effusion (mild) is present.  Nose: Nose normal. Right sinus exhibits no maxillary sinus tenderness and no frontal sinus tenderness. Left sinus exhibits no maxillary sinus tenderness and no frontal sinus tenderness.  Mouth/Throat: Uvula is midline. Posterior oropharyngeal erythema present. Tonsils are 1+ on the right. Tonsils are 2+ on the left. Tonsillar exudate (on bilateral tonsils).  Eyes: Conjunctivae are normal.  Neck: Normal range of motion.  Cardiovascular: Normal  rate, regular rhythm and normal heart sounds.   Pulmonary/Chest: Effort normal and breath sounds normal. She has no wheezes. She has no rales.  Lymphadenopathy:       Head (right side): No submental, no submandibular, no tonsillar, no preauricular, no posterior auricular and no occipital adenopathy present.       Head (left side): No submental, no submandibular, no tonsillar, no preauricular, no posterior auricular and no occipital adenopathy present.    She has cervical adenopathy.       Right cervical: Superficial cervical adenopathy present. No deep cervical and no posterior cervical adenopathy present.      Left cervical: Superficial cervical adenopathy present. No deep cervical and no posterior cervical adenopathy present.       Right: No supraclavicular adenopathy present.       Left: No supraclavicular adenopathy present.  Neurological: She is alert and oriented to person, place, and time.  Skin: Skin is warm and dry.  Psychiatric: She has a normal mood and affect.  Vitals reviewed.   Results for orders placed or performed in visit on 04/23/17 (from the past 24 hour(s))  POCT rapid strep A     Status: None   Collection Time: 04/23/17 11:03 AM  Result Value Ref Range   Rapid Strep A Screen Negative Negative    Assessment and Plan :  1. Sore throat Due to recent exposure, PE findings, and Centor score of 3, will treat empirically for strep throat. Instructed to use OTC ibuprofen and hydrate with at least 64 oz of water daily.  Return to clinic if symptoms worsen, do not improve, or as needed - POCT rapid strep A - amoxicillin (AMOXIL) 500 MG capsule; Take 1 capsule (500 mg total) by mouth 2 (two) times daily.  Dispense: 20 capsule; Refill: 0 - Care order/instruction: - Culture, Group A Strep    Tenna Delaine, PA-C  [facility] 04/23/2017 11:54 AM

## 2017-04-23 NOTE — Patient Instructions (Addendum)
Take antibiotics as prescribed. I also recommend using OTC ibuprofen for sore throat. Make sure you are drinking at least 64 oz of water a day as it appears you are dehydrated. You should be feeling better in 24-48 hours, not worse. If you feel worse or develop new concerning symptoms please seek care immediatly. Thank you for letting me participate in your health and well being.   Strep Throat Strep throat is an infection of the throat. It is caused by germs. Strep throat spreads from person to person because of coughing, sneezing, or close contact. Follow these instructions at home: Medicines   Take over-the-counter and prescription medicines only as told by your doctor.  Take your antibiotic medicine as told by your doctor. Do not stop taking the medicine even if you feel better.  Have family members who also have a sore throat or fever go to a doctor. Eating and drinking   Do not share food, drinking cups, or personal items.  Try eating soft foods until your sore throat feels better.  Drink enough fluid to keep your pee (urine) clear or pale yellow. General instructions   Rinse your mouth (gargle) with a salt-water mixture 3-4 times per day or as needed. To make a salt-water mixture, stir -1 tsp of salt into 1 cup of warm water.  Make sure that all people in your house wash their hands well.  Rest.  Stay home from school or work until you have been taking antibiotics for 24 hours.  Keep all follow-up visits as told by your doctor. This is important. Contact a doctor if:  Your neck keeps getting bigger.  You get a rash, cough, or earache.  You cough up thick liquid that is green, yellow-brown, or bloody.  You have pain that does not get better with medicine.  Your problems get worse instead of getting better.  You have a fever. Get help right away if:  You throw up (vomit).  You get a very bad headache.  You neck hurts or it feels stiff.  You have chest pain or  you are short of breath.  You have drooling, very bad throat pain, or changes in your voice.  Your neck is swollen or the skin gets red and tender.  Your mouth is dry or you are peeing less than normal.  You keep feeling more tired or it is hard to wake up.  Your joints are red or they hurt. This information is not intended to replace advice given to you by your health care provider. Make sure you discuss any questions you have with your health care provider. Document Released: 05/06/2008 Document Revised: 07/17/2016 Document Reviewed: 03/13/2015 Elsevier Interactive Patient Education  2017 [employer].   IF you received an x-ray today, you will receive an invoice from [facility]. Please contact [facility] at [phone] with questions or concerns regarding your invoice.   IF you received labwork today, you will receive an invoice from [place]. Please contact [facility] at [phone] with questions or concerns regarding your invoice.   Our billing staff will not be able to assist you with questions regarding bills from these companies.  You will be contacted with the lab results as soon as they are available. The fastest way to get your results is to activate your My Chart account. Instructions are located on the last page of this paperwork. If you have not heard from [place] regarding the results in 2 weeks, please contact this office.

## 2017-04-23 NOTE — Progress Notes (Deleted)
    MRN: [id number] DOB: Apr 12, 1961  Subjective:   Vanessa Nixon is a 56 y.o. female presenting for chief complaint of Sore Throat (x 1 day); Headache; and Generalized Body Aches .  Reports *** history of {URI Symptoms :210800001}, {Systemic Symptoms:8477748943}. Has tried *** relief. Denies ***fever, {URI Symptoms :210800001}, {Systemic Symptoms:8477748943}. Has *** had *** sick contact with ***. *** history of seasonal allergies, history of asthma. Patient *** flu shot this season. *** smoking, *** alcohol. Denies any other aggravating or relieving factors, no other questions or concerns.  Vanessa Nixon has a current medication list which includes the following prescription(s): multivitamin with minerals. Also is allergic to codeine.  Vanessa Nixon  has no past medical history on file. Also  has no past surgical history on file.   Objective:   Vitals: BP (!) 147/79   Pulse (!) 109   Temp 98.6 F (37 C) (Oral)   Resp 18   Ht 5\' 1"  (1.549 m)   Wt 141 lb 9.6 oz (64.2 kg)   LMP 04/23/2017   SpO2 98%   BMI 26.76 kg/m   Physical Exam  Results for orders placed or performed in visit on 04/23/17 (from the past 24 hour(s))  POCT rapid strep A     Status: None   Collection Time: 04/23/17 11:03 AM  Result Value Ref Range   Rapid Strep A Screen Negative Negative    Assessment and Plan :  1. Sore throat *** - POCT rapid strep A   Vanessa Delaine, PA-C  [facility] 04/23/2017 11:27 AM

## 2017-04-25 LAB — CULTURE, GROUP A STREP: Strep A Culture: NEGATIVE

## 2018-02-03 ENCOUNTER — Ambulatory Visit: Payer: 59 | Admitting: Physician Assistant

## 2018-02-03 ENCOUNTER — Encounter: Payer: Self-pay | Admitting: Physician Assistant

## 2018-02-03 ENCOUNTER — Other Ambulatory Visit: Payer: Self-pay

## 2018-02-03 VITALS — BP 140/70 | HR 85 | Temp 98.3°F | Resp 18 | Ht 61.0 in | Wt 148.8 lb

## 2018-02-03 DIAGNOSIS — M25521 Pain in right elbow: Secondary | ICD-10-CM | POA: Diagnosis not present

## 2018-02-03 DIAGNOSIS — R030 Elevated blood-pressure reading, without diagnosis of hypertension: Secondary | ICD-10-CM

## 2018-02-03 MED ORDER — MELOXICAM 7.5 MG PO TABS: 7.5000 mg | ORAL_TABLET | Freq: Every day | ORAL | 1 refills | Status: DC

## 2018-02-03 NOTE — Patient Instructions (Addendum)
Tennis Elbow Tennis elbow is puffiness (inflammation) of the outer tendons of your forearm close to your elbow. Your tendons attach your muscles to your bones. Tennis elbow can happen in any sport or job in which you use your elbow too much. It is caused by doing the same motion over and over. Tennis elbow can cause:  Pain and tenderness in your forearm and the outer part of your elbow.  A burning feeling. This runs from your elbow through your arm.  Weak grip in your hands.  Follow these instructions at home: Activity  Rest your elbow and wrist as told by your doctor. Try to avoid any activities that caused the problem until your doctor says that you can do them again.  If a physical therapist teaches you exercises, do all of them as told.  If you lift an object, lift it with your palm facing up. This is easier on your elbow. Lifestyle  If your tennis elbow is caused by sports, check your equipment and make sure that: ? You are using it correctly. ? It fits you well.  If your tennis elbow is caused by work, take breaks often, if you are able. Talk with your manager about doing your work in a way that is safe for you. ? If your tennis elbow is caused by computer use, talk with your manager about any changes that can be made to your work setup. General instructions  If told, apply ice to the painful area: ? Put ice in a plastic bag. ? Place a towel between your skin and the bag. ? Leave the ice on for 20 minutes, 2-3 times per day.  Take medicines only as told by your doctor.  If you were given a brace, wear it as told by your doctor.  Keep all follow-up visits as told by your doctor. This is important. Contact a doctor if:  Your pain does not get better with treatment.  Your pain gets worse.  You have weakness in your forearm, hand, or fingers.  You cannot feel your forearm, hand, or fingers. This information is not intended to replace advice given to you by your health  care provider. Make sure you discuss any questions you have with your health care provider. Document Released: 05/08/2010 Document Revised: 07/18/2016 Document Reviewed: 11/14/2014 Elsevier Interactive Patient Education  2018 [employer].     IF you received an x-ray today, you will receive an invoice from [facility]. Please contact [facility] at [phone] with questions or concerns regarding your invoice.   IF you received labwork today, you will receive an invoice from [facility]. Please contact [facility] at [phone] with questions or concerns regarding your invoice.   Our billing staff will not be able to assist you with questions regarding bills from these companies.  You will be contacted with the lab results as soon as they are available. The fastest way to get your results is to activate your My Chart account. Instructions are located on the last page of this paperwork. If you have not heard from [place] regarding the results in 2 weeks, please contact this office.

## 2018-02-03 NOTE — Progress Notes (Signed)
   Vanessa Nixon  MRN: [id number] DOB: 12/27/1960  PCP: Wardell Honour, MD  Chief Complaint  Patient presents with  . Elbow Injury    right elbow pain x1week     Subjective:  Pt presents to clinic for right elbow pain that started about a week ago. She 1st noticed with while pumping up a blood pressure cuff. She has not hurt it that she knows of.  She tried motrin and she did not think 1 dose did anything.  She has used no ice.  Heat feels good.  History is obtained by patient.  Review of Systems  Musculoskeletal: Negative for joint swelling.    There are no active problems to display for this patient.   Current Outpatient Medications on File Prior to Visit  Medication Sig Dispense Refill  . Multiple Vitamins-Minerals (MULTIVITAMIN WITH MINERALS) tablet Take 1 tablet by mouth daily.     No current facility-administered medications on file prior to visit.     Allergies  Allergen Reactions  . Codeine Itching    No past medical history on file. Social History   Social History Narrative  . Not on file   Social History   Tobacco Use  . Smoking status: Never Smoker  . Smokeless tobacco: Never Used  Substance Use Topics  . Alcohol use: Not on file  . Drug use: No   family history is not on file.     Objective:  BP 140/70   Pulse 85   Temp 98.3 F (36.8 C) (Oral)   Resp 18   Ht 5\' 1"  (1.549 m)   Wt 148 lb 12.8 oz (67.5 kg)   LMP 02/03/2018   SpO2 99%   BMI 28.12 kg/m  Body mass index is 28.12 kg/m.  Physical Exam  Constitutional: She is oriented to person, place, and time and well-developed, well-nourished, and in no distress.  HENT:  Head: Normocephalic and atraumatic.  Right Ear: Hearing and external ear normal.  Left Ear: Hearing and external ear normal.  Eyes: Conjunctivae are normal.  Neck: Normal range of motion.  Pulmonary/Chest: Effort normal.  Musculoskeletal:       Right elbow: She exhibits normal range of motion and no swelling.  Tenderness found. Lateral epicondyle tenderness noted. No medial epicondyle tenderness noted.  Pain with flexion that increases with resistance.  Pain with supination that increases with resistance.  Neurological: She is alert and oriented to person, place, and time. Gait normal.  Skin: Skin is warm and dry.  Psychiatric: Mood, memory, affect and judgment normal.  Vitals reviewed.   Assessment and Plan :  Right elbow pain - Plan: meloxicam (MOBIC) 7.5 MG tablet - ice massage to the area.  If not improved she can get a tennis elbow strap to help with pain and pressure - try and use left hand more esp for taing blood pressure cuff.  Elevated BP without diagnosis of hypertension - pt to monitor - this is elevated  Windell Hummingbird PA-C  [facility] 02/03/2018 6:35 PM

## 2018-02-04 ENCOUNTER — Ambulatory Visit: Payer: Self-pay | Admitting: *Deleted

## 2018-02-04 NOTE — Telephone Encounter (Signed)
Pt seen in office for right elbow pain on 3/5 and was also noted to have elevated BP without diagnosis of hypertension. Pt called office today and wanted to be seen in the office to address high blood pressures. Pt requesting to have an appt for Saturday due to not wanting to miss work. Pt does not have a way to check BP at home at this time and most recent reading was in the office on yesterday. Pt denies having chest pain, SOB, dizziness at this time. Pt states she feels pressure in her head. Pt states for the past month she feels like her heart is pounding and states that the feeling comes and goes and denies feeling this symptom at this time. Pt did not mention having these symptoms to provider during office visit on 3/5.  Advised pt to call office back to be seen for a sooner appt or to go to the ED if symptoms of heart pounding returned or if pt developed other symptoms such as chest pain,SOB or dizziness. Pt verbalized understanding. Appt scheduled for 3/9.  Reason for Disposition . [1] Systolic BP  >= 840 OR Diastolic >= 90 AND [3] not taking BP medications  Protocols used: HIGH BLOOD PRESSURE-A-AH

## 2018-02-07 ENCOUNTER — Ambulatory Visit: Payer: 59 | Admitting: Family Medicine

## 2018-02-07 ENCOUNTER — Encounter: Payer: Self-pay | Admitting: Family Medicine

## 2018-02-07 VITALS — BP 136/77 | HR 76 | Temp 98.5°F | Resp 16 | Ht 61.0 in | Wt 150.0 lb

## 2018-02-07 DIAGNOSIS — R002 Palpitations: Secondary | ICD-10-CM

## 2018-02-07 DIAGNOSIS — R030 Elevated blood-pressure reading, without diagnosis of hypertension: Secondary | ICD-10-CM | POA: Diagnosis not present

## 2018-02-07 NOTE — Patient Instructions (Addendum)
1. Trial off caffeine 2. Outpatient monitoring of BP.     IF you received an x-ray today, you will receive an invoice from [facility]. Please contact [facility] at [phone] with questions or concerns regarding your invoice.   IF you received labwork today, you will receive an invoice from [facility]. Please contact [facility] at [phone] with questions or concerns regarding your invoice.   Our billing staff will not be able to assist you with questions regarding bills from these companies.  You will be contacted with the lab results as soon as they are available. The fastest way to get your results is to activate your My Chart account. Instructions are located on the last page of this paperwork. If you have not heard from [place] regarding the results in 2 weeks, please contact this office.

## 2018-02-07 NOTE — Progress Notes (Signed)
3/9/201910:08 AM  Vanessa Nixon 26-Mar-1961, 57 y.o. female [id number]  Chief Complaint  Patient presents with  . Hypertension  . Palpitations    HPI:   Patient is a 57 y.o. female who presents today for to followup on elevated BP reading from last visit.  She was seen about a week ago with right elbow pain and was found to have elevated BP, was asked to follow-up for a blood pressure check. She reports having had intermittent elevated readings at the doctor's office before. She does not check her BP at home.   She reports several months of an odd chest sensation, it feels her heart is pounding really hard, not fast or irregular but really hard. She denies chest pain, SOB, nausea, diaphoresis, lightheadedness, vision changes with this. She sometimes feels pressure in her head. Sometimes she feels mild peri-oral tingling. she is unable to describe a pattern, but mentions they can happen when she lies down or after eating. She denies GERD. She denies any increase in anxiety. She thinks it might be related to caffeine intake. She denies fhx CAD. She does not exercise. She does not smoke. She believes to be in overall good health other than told she has had anemia in the past. She currently has no symptoms.  Depression screen [facility] 2/9 02/07/2018 04/23/2017 01/22/2017  Decreased Interest 0 0 0  Down, Depressed, Hopeless 0 0 0  PHQ - 2 Score 0 0 0    Allergies  Allergen Reactions  . Codeine Itching    Prior to Admission medications   Medication Sig Start Date End Date Taking? Authorizing Provider  Multiple Vitamins-Minerals (MULTIVITAMIN WITH MINERALS) tablet Take 1 tablet by mouth daily.   Yes [provider]  meloxicam (MOBIC) 7.5 MG tablet Take 1-2 tablets (7.5-15 mg total) by mouth daily. Patient not taking: Reported on 02/07/2018 02/03/18   Mancel Bale, PA-C    History reviewed. No pertinent past medical history.  History reviewed. No pertinent surgical history.  Social  History   Tobacco Use  . Smoking status: Never Smoker  . Smokeless tobacco: Never Used  Substance Use Topics  . Alcohol use: Not on file    History reviewed. No pertinent family history.  Review of Systems  HENT: Negative for congestion, ear pain, sinus pain, sore throat and tinnitus.   Gastrointestinal: Negative for abdominal pain, constipation, diarrhea, nausea and vomiting.   Per hpi  OBJECTIVE:  Blood pressure 136/77, pulse 76, temperature 98.5 F (36.9 C), temperature source Oral, resp. rate 16, height 5\' 1"  (1.549 m), weight 150 lb (68 kg), last menstrual period 02/03/2018, SpO2 97 %.  Physical Exam  Constitutional: She is oriented to person, place, and time and well-developed, well-nourished, and in no distress.  HENT:  Head: Normocephalic and atraumatic.  Right Ear: Hearing, tympanic membrane, external ear and ear canal normal.  Left Ear: Hearing, tympanic membrane, external ear and ear canal normal.  Mouth/Throat: Oropharynx is clear and moist.  Eyes: EOM are normal. Pupils are equal, round, and reactive to light.  Neck: Neck supple. No thyromegaly present.  Cardiovascular: Normal rate, regular rhythm, normal heart sounds and intact distal pulses. Exam reveals no gallop and no friction rub.  No murmur heard. Pulmonary/Chest: Effort normal and breath sounds normal. She has no wheezes. She has no rales.  Abdominal: Soft. Bowel sounds are normal. She exhibits no distension and no mass. There is no tenderness.  Musculoskeletal: Normal range of motion. She exhibits no edema.  Lymphadenopathy:  She has no cervical adenopathy.  Neurological: She is alert and oriented to person, place, and time. She has normal reflexes. Gait normal.  Skin: Skin is warm and dry.  Psychiatric: Mood and affect normal.  Nursing note and vitals reviewed.   EKG: NSR, HR 74, inverted T waves V1 and V2  ASSESSMENT and PLAN  1. Palpitations Unclear etiology, exam and ekg today unremarkable.  Per history and timing of lying down and after meals, possibly reflux related.. Will order labs, eliminate caffeine, monitor. RTC precautions reviewed.  - EKG 12-Lead - CBC - Comprehensive metabolic panel - TSH - Lipid panel  2. Elevated BP without diagnosis of hypertension Today less then 140/90, discussed outpatient monitoring. - CBC - Comprehensive metabolic panel - TSH - Lipid panel  Return in about 4 weeks (around 03/07/2018).    Rutherford Guys, MD [facility] [place], [place] [address] Ph.  [phone] Fax [phone]

## 2018-02-08 LAB — COMPREHENSIVE METABOLIC PANEL
ALT: 11 IU/L (ref 0–32)
AST: 15 IU/L (ref 0–40)
Albumin/Globulin Ratio: 1.4 (ref 1.2–2.2)
Albumin: 4.3 g/dL (ref 3.5–5.5)
Alkaline Phosphatase: 71 IU/L (ref 39–117)
BUN/Creatinine Ratio: 15 (ref 9–23)
BUN: 10 mg/dL (ref 6–24)
Bilirubin Total: 0.4 mg/dL (ref 0.0–1.2)
CO2: 22 mmol/L (ref 20–29)
Calcium: 9.3 mg/dL (ref 8.7–10.2)
Chloride: 99 mmol/L (ref 96–106)
Creatinine, Ser: 0.67 mg/dL (ref 0.57–1.00)
GFR calc Af Amer: 113 mL/min/{1.73_m2} (ref >59)
GFR calc non Af Amer: 98 mL/min/{1.73_m2} (ref >59)
Globulin, Total: 3 g/dL (ref 1.5–4.5)
Glucose: 83 mg/dL (ref 65–99)
Potassium: 4.3 mmol/L (ref 3.5–5.2)
Sodium: 137 mmol/L (ref 134–144)
Total Protein: 7.3 g/dL (ref 6.0–8.5)

## 2018-02-08 LAB — CBC
Hematocrit: 38.4 % (ref 34.0–46.6)
Hemoglobin: 12 g/dL (ref 11.1–15.9)
MCH: 21.4 pg — ABNORMAL LOW (ref 26.6–33.0)
MCHC: 31.3 g/dL — ABNORMAL LOW (ref 31.5–35.7)
MCV: 69 fL — ABNORMAL LOW (ref 79–97)
Platelets: 246 10*3/uL (ref 150–379)
RBC: 5.6 x10E6/uL — ABNORMAL HIGH (ref 3.77–5.28)
RDW: 15.1 % (ref 12.3–15.4)
WBC: 9 10*3/uL (ref 3.4–10.8)

## 2018-02-08 LAB — LIPID PANEL
Chol/HDL Ratio: 5.2 ratio — ABNORMAL HIGH (ref 0.0–4.4)
Cholesterol, Total: 198 mg/dL (ref 100–199)
HDL: 38 mg/dL — ABNORMAL LOW (ref >39)
LDL Calculated: 120 mg/dL — ABNORMAL HIGH (ref 0–99)
Triglycerides: 198 mg/dL — ABNORMAL HIGH (ref 0–149)
VLDL Cholesterol Cal: 40 mg/dL (ref 5–40)

## 2018-02-08 LAB — TSH: TSH: 1.93 u[IU]/mL (ref 0.450–4.500)

## 2018-02-11 ENCOUNTER — Encounter: Payer: Self-pay | Admitting: Family Medicine

## 2018-05-18 ENCOUNTER — Encounter: Payer: Self-pay | Admitting: Physician Assistant

## 2018-05-18 ENCOUNTER — Other Ambulatory Visit: Payer: Self-pay

## 2018-05-18 ENCOUNTER — Ambulatory Visit (INDEPENDENT_AMBULATORY_CARE_PROVIDER_SITE_OTHER): Payer: 59 | Admitting: Physician Assistant

## 2018-05-18 VITALS — BP 145/77 | HR 77 | Temp 98.0°F | Resp 18 | Ht 61.42 in | Wt 143.8 lb

## 2018-05-18 DIAGNOSIS — N852 Hypertrophy of uterus: Secondary | ICD-10-CM | POA: Diagnosis not present

## 2018-05-18 DIAGNOSIS — N898 Other specified noninflammatory disorders of vagina: Secondary | ICD-10-CM

## 2018-05-18 DIAGNOSIS — N920 Excessive and frequent menstruation with regular cycle: Secondary | ICD-10-CM

## 2018-05-18 DIAGNOSIS — Z1231 Encounter for screening mammogram for malignant neoplasm of breast: Secondary | ICD-10-CM

## 2018-05-18 DIAGNOSIS — R030 Elevated blood-pressure reading, without diagnosis of hypertension: Secondary | ICD-10-CM

## 2018-05-18 DIAGNOSIS — L309 Dermatitis, unspecified: Secondary | ICD-10-CM

## 2018-05-18 DIAGNOSIS — Z124 Encounter for screening for malignant neoplasm of cervix: Secondary | ICD-10-CM

## 2018-05-18 DIAGNOSIS — Z1211 Encounter for screening for malignant neoplasm of colon: Secondary | ICD-10-CM

## 2018-05-18 DIAGNOSIS — Z1239 Encounter for other screening for malignant neoplasm of breast: Secondary | ICD-10-CM

## 2018-05-18 DIAGNOSIS — Z0000 Encounter for general adult medical examination without abnormal findings: Secondary | ICD-10-CM

## 2018-05-18 MED ORDER — CLOBETASOL PROPIONATE 0.05 % EX OINT: 1.0000 "application " | TOPICAL_OINTMENT | Freq: Two times a day (BID) | CUTANEOUS | 0 refills | Status: DC

## 2018-05-18 NOTE — Progress Notes (Signed)
Vanessa Nixon  MRN: [id number] DOB: [birth year]  Subjective:  Pt is a 57 y.o. female who presents for annual physical exam.   Diet: Snacks on high salty foods. Eats out a lot. Eats a variety of foods. Takes daily multivitamin. Drinks sweet tea and water.  Exercise:  Walks some.  Sleep: 6ish hours, has hot flashes throughout the night BM: Every other day, she has IBS Menstrual cycles: Regular, occur monthly. This past cycle was lighter but they are sometimes very heavy and last about 2 weeks. Currently on her cycle now.    Last annual exam: 2015 Last dental exam: Every 6 months Last vision exam: ~2 years Last pap smear: 2015 Last mammogram: 2009,  FH of breast cancer in mother and sister. Last colonoscopy: Never Vaccinations      Tetanus: 2015      Shingrix: Never  There are no active problems to display for this patient.   Current Outpatient Medications on File Prior to Visit  Medication Sig Dispense Refill  . Multiple Vitamins-Minerals (MULTIVITAMIN WITH MINERALS) tablet Take 1 tablet by mouth daily.     No current facility-administered medications on file prior to visit.     Allergies  Allergen Reactions  . Codeine Itching    Social History   Socioeconomic History  . Marital status: Married    Spouse name: Not on file  . Number of children: 5  . Years of education: Not on file  . Highest education level: Bachelor's degree (e.g., BA, AB, BS)  Occupational History  . Not on file  Social Needs  . Financial resource strain: Not hard at all  . Food insecurity:    Worry: Never true    Inability: Never true  . Transportation needs:    Medical: No    Non-medical: No  Tobacco Use  . Smoking status: Never Smoker  . Smokeless tobacco: Never Used  Substance and Sexual Activity  . Alcohol use: Never    Alcohol/week: 0.0 oz    Frequency: Never  . Drug use: No  . Sexual activity: Yes    Partners: Male    Birth control/protection: Condom    Comment: with  monogamous husband  Lifestyle  . Physical activity:    Days per week: 0 days    Minutes per session: 0 min  . Stress: Only a little  Relationships  . Social connections:    Talks on phone: More than three times a week    Gets together: Once a week    Attends religious service: More than 4 times per year    Active member of club or organization: Yes    Attends meetings of clubs or organizations: More than 4 times per year    Relationship status: Married  Other Topics Concern  . Not on file  Social History Narrative   Pt is from [place]. Works at [employer] with [employer]. Lives at home with husband and 4 of 5 children.     Past Surgical History:  Procedure Laterality Date  . CESAREAN SECTION      Family History  Problem Relation Age of Onset  . COPD Mother   . Cancer Mother   . Arthritis Mother   . Alzheimer's disease Father   . Cancer Father   . Cancer Sister     Review of Systems  Constitutional: Negative for activity change, appetite change, chills, diaphoresis, fatigue, fever and unexpected weight change.  HENT: Negative for congestion, dental problem, drooling, ear discharge, ear  pain, facial swelling, hearing loss, mouth sores, nosebleeds, postnasal drip, rhinorrhea, sinus pressure, sinus pain, sneezing, sore throat, tinnitus, trouble swallowing and voice change.   Eyes: Negative for photophobia, pain, discharge, redness, itching and visual disturbance.  Respiratory: Negative for apnea, cough, choking, chest tightness, shortness of breath, wheezing and stridor.   Cardiovascular: Negative for chest pain, palpitations and leg swelling.  Gastrointestinal: Negative for abdominal distention, abdominal pain, anal bleeding, blood in stool, constipation, diarrhea, nausea, rectal pain and vomiting.  Endocrine: Negative for cold intolerance, heat intolerance, polydipsia, polyphagia and polyuria.  Genitourinary: Positive for menstrual problem. Negative for decreased urine  volume, difficulty urinating, dyspareunia, dysuria, enuresis, flank pain, frequency, genital sores, hematuria, pelvic pain, urgency, vaginal bleeding, vaginal discharge and vaginal pain.  Musculoskeletal: Positive for arthralgias. Negative for back pain, gait problem, joint swelling, myalgias, neck pain and neck stiffness.  Skin: Positive for rash (pt has PMH of dishydrotic eczema, has flare up on left palm that has been present for a while, ran out of clobetasol ointment). Negative for color change, pallor and wound.  Allergic/Immunologic: Positive for environmental allergies. Negative for food allergies and immunocompromised state.  Neurological: Negative for dizziness, tremors, seizures, syncope, facial asymmetry, speech difficulty, weakness, light-headedness, numbness and headaches.  Hematological: Negative for adenopathy. Does not bruise/bleed easily.  Psychiatric/Behavioral: Negative for agitation, behavioral problems, confusion, decreased concentration, dysphoric mood, hallucinations, self-injury, sleep disturbance and suicidal ideas. The patient is not nervous/anxious and is not hyperactive.     Objective:  BP (!) 145/77   Pulse 77   Temp 98 F (36.7 C) (Oral)   Resp 18   Ht 5' 1.42" (1.56 m)   Wt 143 lb 12.8 oz (65.2 kg)   LMP 05/18/2018 (Exact Date)   SpO2 100%   BMI 26.80 kg/m   Physical Exam  Constitutional: She is oriented to person, place, and time. She appears well-developed and well-nourished. No distress.  HENT:  Head: Normocephalic and atraumatic.  Right Ear: Hearing, tympanic membrane, external ear and ear canal normal.  Left Ear: Hearing, tympanic membrane, external ear and ear canal normal.  Nose: Nose normal.  Mouth/Throat: Uvula is midline, oropharynx is clear and moist and mucous membranes are normal. No oropharyngeal exudate.  Eyes: Pupils are equal, round, and reactive to light. Conjunctivae, EOM and lids are normal. No scleral icterus.  Neck: Trachea normal  and normal range of motion. No thyroid mass and no thyromegaly present.  Cardiovascular: Normal rate, regular rhythm, normal heart sounds and intact distal pulses.  Pulmonary/Chest: Effort normal and breath sounds normal. Right breast exhibits no inverted nipple, no mass, no nipple discharge, no skin change and no tenderness. Left breast exhibits no inverted nipple, no mass, no nipple discharge, no skin change and no tenderness. No breast swelling, tenderness, discharge or bleeding. Breasts are symmetrical.  Abdominal: Soft. Normal appearance and bowel sounds are normal. There is no tenderness.  Genitourinary: Vagina normal. Uterus is enlarged (palpated uterus up to umbilical region\). Uterus is not tender. Cervix exhibits discharge (bloody discharge exiting cervical os). Cervix exhibits no motion tenderness. Right adnexum displays no mass, no tenderness and no fullness. Left adnexum displays no mass, no tenderness and no fullness.  Genitourinary Comments: CMA chaperone present for breast and GU exam  Musculoskeletal: Normal range of motion.       Right lower leg: She exhibits edema (1+ to mid shin).       Left lower leg: She exhibits edema (1+ to mid shin).  Lymphadenopathy:  Head (right side): No tonsillar, no preauricular, no posterior auricular and no occipital adenopathy present.       Head (left side): No tonsillar, no preauricular, no posterior auricular and no occipital adenopathy present.    She has no cervical adenopathy.       Right: No supraclavicular adenopathy present.       Left: No supraclavicular adenopathy present.  Neurological: She is alert and oriented to person, place, and time. She has normal strength and normal reflexes.  Skin: Skin is warm and dry.  Deep seated erythematous vesicles noted on palmar aspect of left hand.     Visual Acuity Screening   Right eye Left eye Both eyes  Without correction:     With correction: 20/15 20/15 20/15     BP Readings from Last 3  Encounters:  05/18/18 (!) 145/77  02/07/18 136/77  02/03/18 140/70    Assessment and Plan :  Discussed healthy lifestyle, diet, exercise, preventative care, vaccinations, and addressed patient's concerns. Plan for follow up in one year. Otherwise, plan for specific conditions below.  1. Annual physical exam Pt had basic lab panel in 01/2018. All normal except elevated LDL and triglycerides.  2. Screen for colon cancer - Ambulatory referral to Gastroenterology  3. Screening for cervical cancer - Pap IG and HPV (high risk) DNA detection  4. Screening for breast cancer Given info to schedule mammagram.  5. Menorrhagia with regular cycle - Ambulatory referral to Gynecology  6. Vaginal itching Not present today but would like test for yeast. Lab pending. - Wet Prep for Trick, Yeast, Clue  7. Enlarged uterus - Ambulatory referral to Gynecology  8. Elevated blood pressure reading Asymptomatic. Instructed to check bp outside of office over the next couple of weeks. Return if consistently >140/90. Given strict ED precautions.   9. Hand eczema - clobetasol ointment (TEMOVATE) 0.05 %; Apply 1 application topically 2 (two) times daily.  Dispense: 30 g; Refill: 0   Tenna Delaine, PA-C  [facility] 05/18/2018 9:42 AM

## 2018-05-18 NOTE — Patient Instructions (Addendum)
In terms of elevated blood pressure, I would like you to check your blood pressure at least a couple times over the next week outside of the office and document these values. It is best if you check the blood pressure at different times in the day. Your goal is <140/90. If your values are consistently above this goal, please return to office for further evaluation. If you start to have chest pain, blurred vision, shortness of breath, severe headache, lower leg swelling, or nausea/vomiting please seek care immediately here or at the ED.    For eczema, use topical ointment twice daily for two to four weeks.   Gyn and GI should contact you within 2 weeks.  Thank you for letting me participate in your health and well being.   Health Maintenance, Female Adopting a healthy lifestyle and getting preventive care can go a long way to promote health and wellness. Talk with your health care provider about what schedule of regular examinations is right for you. This is a good chance for you to check in with your provider about disease prevention and staying healthy. In between checkups, there are plenty of things you can do on your own. Experts have done a lot of research about which lifestyle changes and preventive measures are most likely to keep you healthy. Ask your health care provider for more information. Weight and diet Eat a healthy diet  Be sure to include plenty of vegetables, fruits, low-fat dairy products, and lean protein.  Do not eat a lot of foods high in solid fats, added sugars, or salt.  Get regular exercise. This is one of the most important things you can do for your health. ? Most adults should exercise for at least 150 minutes each week. The exercise should increase your heart rate and make you sweat (moderate-intensity exercise). ? Most adults should also do strengthening exercises at least twice a week. This is in addition to the moderate-intensity exercise.  Maintain a healthy  weight  Body mass index (BMI) is a measurement that can be used to identify possible weight problems. It estimates body fat based on height and weight. Your health care provider can help determine your BMI and help you achieve or maintain a healthy weight.  For females 77 years of age and older: ? A BMI below 18.5 is considered underweight. ? A BMI of 18.5 to 24.9 is normal. ? A BMI of 25 to 29.9 is considered overweight. ? A BMI of 30 and above is considered obese.  Watch levels of cholesterol and blood lipids  You should start having your blood tested for lipids and cholesterol at 57 years of age, then have this test every 5 years.  You may need to have your cholesterol levels checked more often if: ? Your lipid or cholesterol levels are high. ? You are older than 57 years of age. ? You are at high risk for heart disease.  Cancer screening Lung Cancer  Lung cancer screening is recommended for adults 77-7 years old who are at high risk for lung cancer because of a history of smoking.  A yearly low-dose CT scan of the lungs is recommended for people who: ? Currently smoke. ? Have quit within the past 15 years. ? Have at least a 30-pack-year history of smoking. A pack year is smoking an average of one pack of cigarettes a day for 1 year.  Yearly screening should continue until it has been 15 years since you quit.  Yearly  screening should stop if you develop a health problem that would prevent you from having lung cancer treatment.  Breast Cancer  Practice breast self-awareness. This means understanding how your breasts normally appear and feel.  It also means doing regular breast self-exams. Let your health care provider know about any changes, no matter how small.  If you are in your 20s or 30s, you should have a clinical breast exam (CBE) by a health care provider every 1-3 years as part of a regular health exam.  If you are 72 or older, have a CBE every year. Also consider  having a breast X-ray (mammogram) every year.  If you have a family history of breast cancer, talk to your health care provider about genetic screening.  If you are at high risk for breast cancer, talk to your health care provider about having an MRI and a mammogram every year.  Breast cancer gene (BRCA) assessment is recommended for women who have family members with BRCA-related cancers. BRCA-related cancers include: ? Breast. ? Ovarian. ? Tubal. ? Peritoneal cancers.  Results of the assessment will determine the need for genetic counseling and BRCA1 and BRCA2 testing.  Cervical Cancer Your health care provider may recommend that you be screened regularly for cancer of the pelvic organs (ovaries, uterus, and vagina). This screening involves a pelvic examination, including checking for microscopic changes to the surface of your cervix (Pap test). You may be encouraged to have this screening done every 3 years, beginning at age 102.  For women ages 78-65, health care providers may recommend pelvic exams and Pap testing every 3 years, or they may recommend the Pap and pelvic exam, combined with testing for human papilloma virus (HPV), every 5 years. Some types of HPV increase your risk of cervical cancer. Testing for HPV may also be done on women of any age with unclear Pap test results.  Other health care providers may not recommend any screening for nonpregnant women who are considered low risk for pelvic cancer and who do not have symptoms. Ask your health care provider if a screening pelvic exam is right for you.  If you have had past treatment for cervical cancer or a condition that could lead to cancer, you need Pap tests and screening for cancer for at least 20 years after your treatment. If Pap tests have been discontinued, your risk factors (such as having a new sexual partner) need to be reassessed to determine if screening should resume. Some women have medical problems that increase  the chance of getting cervical cancer. In these cases, your health care provider may recommend more frequent screening and Pap tests.  Colorectal Cancer  This type of cancer can be detected and often prevented.  Routine colorectal cancer screening usually begins at 57 years of age and continues through 57 years of age.  Your health care provider may recommend screening at an earlier age if you have risk factors for colon cancer.  Your health care provider may also recommend using home test kits to check for hidden blood in the stool.  A small camera at the end of a tube can be used to examine your colon directly (sigmoidoscopy or colonoscopy). This is done to check for the earliest forms of colorectal cancer.  Routine screening usually begins at age 49.  Direct examination of the colon should be repeated every 5-10 years through 57 years of age. However, you may need to be screened more often if early forms of precancerous polyps  or small growths are found.  Skin Cancer  Check your skin from head to toe regularly.  Tell your health care provider about any new moles or changes in moles, especially if there is a change in a mole's shape or color.  Also tell your health care provider if you have a mole that is larger than the size of a pencil eraser.  Always use sunscreen. Apply sunscreen liberally and repeatedly throughout the day.  Protect yourself by wearing long sleeves, pants, a wide-brimmed hat, and sunglasses whenever you are outside.  Heart disease, diabetes, and high blood pressure  High blood pressure causes heart disease and increases the risk of stroke. High blood pressure is more likely to develop in: ? People who have blood pressure in the high end of the normal range (130-139/85-89 mm Hg). ? People who are overweight or obese. ? People who are African American.  If you are 65-83 years of age, have your blood pressure checked every 3-5 years. If you are 70 years of age  or older, have your blood pressure checked every year. You should have your blood pressure measured twice-once when you are at a hospital or clinic, and once when you are not at a hospital or clinic. Record the average of the two measurements. To check your blood pressure when you are not at a hospital or clinic, you can use: ? An automated blood pressure machine at a pharmacy. ? A home blood pressure monitor.  If you are between 79 years and 72 years old, ask your health care provider if you should take aspirin to prevent strokes.  Have regular diabetes screenings. This involves taking a blood sample to check your fasting blood sugar level. ? If you are at a normal weight and have a low risk for diabetes, have this test once every three years after 57 years of age. ? If you are overweight and have a high risk for diabetes, consider being tested at a younger age or more often. Preventing infection Hepatitis B  If you have a higher risk for hepatitis B, you should be screened for this virus. You are considered at high risk for hepatitis B if: ? You were born in a country where hepatitis B is common. Ask your health care provider which countries are considered high risk. ? Your parents were born in a high-risk country, and you have not been immunized against hepatitis B (hepatitis B vaccine). ? You have HIV or AIDS. ? You use needles to inject street drugs. ? You live with someone who has hepatitis B. ? You have had sex with someone who has hepatitis B. ? You get hemodialysis treatment. ? You take certain medicines for conditions, including cancer, organ transplantation, and autoimmune conditions.  Hepatitis C  Blood testing is recommended for: ? Everyone born from [birth year] through 1965. ? Anyone with known risk factors for hepatitis C.  Sexually transmitted infections (STIs)  You should be screened for sexually transmitted infections (STIs) including gonorrhea and chlamydia if: ? You are  sexually active and are younger than 57 years of age. ? You are older than 57 years of age and your health care provider tells you that you are at risk for this type of infection. ? Your sexual activity has changed since you were last screened and you are at an increased risk for chlamydia or gonorrhea. Ask your health care provider if you are at risk.  If you do not have HIV, but are at risk,  it may be recommended that you take a prescription medicine daily to prevent HIV infection. This is called pre-exposure prophylaxis (PrEP). You are considered at risk if: ? You are sexually active and do not regularly use condoms or know the HIV status of your partner(s). ? You take drugs by injection. ? You are sexually active with a partner who has HIV.  Talk with your health care provider about whether you are at high risk of being infected with HIV. If you choose to begin PrEP, you should first be tested for HIV. You should then be tested every 3 months for as long as you are taking PrEP. Pregnancy  If you are premenopausal and you may become pregnant, ask your health care provider about preconception counseling.  If you may become pregnant, take 400 to 800 micrograms (mcg) of folic acid every day.  If you want to prevent pregnancy, talk to your health care provider about birth control (contraception). Osteoporosis and menopause  Osteoporosis is a disease in which the bones lose minerals and strength with aging. This can result in serious bone fractures. Your risk for osteoporosis can be identified using a bone density scan.  If you are 71 years of age or older, or if you are at risk for osteoporosis and fractures, ask your health care provider if you should be screened.  Ask your health care provider whether you should take a calcium or vitamin D supplement to lower your risk for osteoporosis.  Menopause may have certain physical symptoms and risks.  Hormone replacement therapy may reduce some  of these symptoms and risks. Talk to your health care provider about whether hormone replacement therapy is right for you. Follow these instructions at home:  Schedule regular health, dental, and eye exams.  Stay current with your immunizations.  Do not use any tobacco products including cigarettes, chewing tobacco, or electronic cigarettes.  If you are pregnant, do not drink alcohol.  If you are breastfeeding, limit how much and how often you drink alcohol.  Limit alcohol intake to no more than 1 drink per day for nonpregnant women. One drink equals 12 ounces of beer, 5 ounces of wine, or 1 ounces of hard liquor.  Do not use street drugs.  Do not share needles.  Ask your health care provider for help if you need support or information about quitting drugs.  Tell your health care provider if you often feel depressed.  Tell your health care provider if you have ever been abused or do not feel safe at home. This information is not intended to replace advice given to you by your health care provider. Make sure you discuss any questions you have with your health care provider. Document Released: 06/03/2011 Document Revised: 04/25/2016 Document Reviewed: 08/22/2015 Elsevier Interactive Patient Education  2018 [employer].     IF you received an x-ray today, you will receive an invoice from [facility]. Please contact [facility] at [phone] with questions or concerns regarding your invoice.   IF you received labwork today, you will receive an invoice from [facility]. Please contact [facility] at [phone] with questions or concerns regarding your invoice.   Our billing staff will not be able to assist you with questions regarding bills from these companies.  You will be contacted with the lab results as soon as they are available. The fastest way to get your results is to activate your My Chart account. Instructions are located on the last page of this  paperwork. If you  have not heard from [place] regarding the results in 2 weeks, please contact this office.    We recommend that you schedule a mammogram for breast cancer screening. Typically, you do not need a referral to do this. Please contact a local imaging center to schedule your mammogram.  [facility] - [phone]  *ask for the Radiology Department [facility] ([place]) - [phone] or [phone]  [facility] - [phone] [place] [phone] [facility] - [phone]  *ask for the [facility] - [phone]  *ask for the Radiology Department [facility] - [phone]  *ask for the [facility] - [phone]

## 2018-05-19 ENCOUNTER — Other Ambulatory Visit: Payer: Self-pay | Admitting: Physician Assistant

## 2018-05-19 LAB — WET PREP FOR TRICH, YEAST, CLUE
Clue Cell Exam: NEGATIVE
TRICHOMONAS EXAM: NEGATIVE
YEAST EXAM: POSITIVE — AB

## 2018-05-19 MED ORDER — FLUCONAZOLE 150 MG PO TABS: 150.0000 mg | ORAL_TABLET | Freq: Once | ORAL | 0 refills | Status: AC

## 2018-05-19 NOTE — Progress Notes (Signed)
Meds ordered this encounter  Medications  . fluconazole (DIFLUCAN) 150 MG tablet    Sig: Take 1 tablet (150 mg total) by mouth once for 1 dose.    Dispense:  1 tablet    Refill:  0    Order Specific Question:   Supervising Provider    Answer:   Wardell Honour [[id number]]

## 2018-05-21 ENCOUNTER — Encounter: Payer: Self-pay | Admitting: Gastroenterology

## 2018-05-21 LAB — PAP IG AND HPV HIGH-RISK
HPV, HIGH-RISK: NEGATIVE
PAP SMEAR COMMENT: 0

## 2018-06-01 ENCOUNTER — Ambulatory Visit: Payer: 59 | Admitting: Gynecology

## 2018-06-01 ENCOUNTER — Encounter: Payer: Self-pay | Admitting: Gynecology

## 2018-06-01 VITALS — BP 120/74 | Ht 61.0 in | Wt 146.0 lb

## 2018-06-01 DIAGNOSIS — N920 Excessive and frequent menstruation with regular cycle: Secondary | ICD-10-CM

## 2018-06-01 DIAGNOSIS — N816 Rectocele: Secondary | ICD-10-CM

## 2018-06-01 DIAGNOSIS — Z803 Family history of malignant neoplasm of breast: Secondary | ICD-10-CM | POA: Diagnosis not present

## 2018-06-01 NOTE — Progress Notes (Signed)
    Vanessa Nixon [birth year] [id number]        57 y.o.  [id number] presents in consultation from [place], PA-C in reference to menorrhagia and enlarged uterus on pelvic exam.  Patient notes over the last for 5 years her periods have become very heavy where she wears pads requiring frequent changes during the heaviest time of her menses as well as through episodes.  Is unable to wear tampons during her menses as she passes them.  They continue regular monthly lasting 7 days.  Hemoglobin tends to be borderline anemic at 12 with microcytic indices despite iron supplementation.  Uses barrier contraception.  5 full-term pregnancies with one cesarean section for breech.  No history of abnormal Pap smears with most recent Pap smear/HPV 05/2018 normal.  History of breast cancer in her mother in her 13s and sister in her 87s.  Unsure if genetic testing was done.  Patient's last mammogram 2009.  Past medical history,surgical history, problem list, medications, allergies, family history and social history were all reviewed and documented in the EPIC chart.  Directed ROS with pertinent positives and negatives documented in the history of present illness/assessment and plan.  Exam: Vanessa Nixon assistant Vitals:   06/01/18 1147  BP: 120/74  Weight: 146 lb (66.2 kg)  Height: 5\' 1"  (1.549 m)   General appearance:  Normal Abdomen soft nontender without masses guarding rebound Pelvic external BUS vagina with second-degree rectocele.  No significant cystocele.  No significant uterine prolapse.  Cervix normal.  Uterus grossly normal size midline mobile nontender.  Adnexa without masses or tenderness.  Rectovaginal exam confirms rectocele otherwise normal.  Assessment/Plan:  57 y.o. [id number] with:  1. Menorrhagia.  Seems worse over the last 5 years.  No intermenstrual bleeding.  Exam shows uterus grossly normal midline mobile.  Reviewed with patient various possibilities to include physiologic, adenomyosis,  small myomas to include submucous as well as endometrial polyps.  Unusual at age 11 to continue to have regular monthly menses without significant menopausal symptoms.  Recommend checking baseline FSH as well as starting with sonohysterogram for pelvic surveillance/endometrial assessment.  Various possibilities to include if structural abnormalities such as polyps or myomas hysteroscopy with resection, hormonal manipulation, Mirena IUD, endometrial ablation all discussed.  Patient agrees to schedule and follow-up for the ultrasound. 2. Rectocele.  Patient does note occasional pressure/bulging but generally not an issue.  I reviewed with her to include diagrams the anatomy of a rectocele.  Options for management to include observation, pessary and surgery discussed.  At this point the patient is not significantly symptomatic and prefers observation. 3. Family history of breast cancer in mother in her 66s and sister at age 19.  Possibilities of genetic linkage reviewed with the patient.  She has not had a mammogram in a number of years and is recommended she proceed with baseline mammogram.  Also discussed referral to genetic counselor and possible genetic testing.  At this point the patient declines referral to genetic counseling but will call if she changes her mind.  I spent a total of 30 face-to-face minutes with the patient, over 50% was spent counseling and coordination of care.  Follow-up referral note was sent to the referring provider.  Anastasio Auerbach MD, 12:30 PM 06/01/2018

## 2018-06-01 NOTE — Patient Instructions (Addendum)
Follow-up for the ultrasound as scheduled.  Call if you want a referral to the genetic counselor to discuss breast cancer gene testing

## 2018-06-02 LAB — FOLLICLE STIMULATING HORMONE: FSH: 29.9 m[IU]/mL

## 2018-07-15 ENCOUNTER — Ambulatory Visit (AMBULATORY_SURGERY_CENTER): Payer: Self-pay

## 2018-07-15 VITALS — Ht 61.0 in | Wt 147.8 lb

## 2018-07-15 DIAGNOSIS — Z1211 Encounter for screening for malignant neoplasm of colon: Secondary | ICD-10-CM

## 2018-07-15 MED ORDER — NA SULFATE-K SULFATE-MG SULF 17.5-3.13-1.6 GM/177ML PO SOLN: 1.0000 | Freq: Once | ORAL | 0 refills | Status: AC

## 2018-07-15 NOTE — Progress Notes (Signed)
No egg or soy allergy known to patient  No issues with past sedation with any surgeries  or procedures, no intubation problems  No diet pills per patient No home 02 use per patient  No blood thinners per patient  Pt denies issues with constipation  No A fib or A flutter  EMMI video sent to pt's e mail. Pt denies  

## 2018-07-16 ENCOUNTER — Encounter: Payer: Self-pay | Admitting: Gastroenterology

## 2018-07-23 ENCOUNTER — Ambulatory Visit (AMBULATORY_SURGERY_CENTER): Payer: 59 | Admitting: Gastroenterology

## 2018-07-23 ENCOUNTER — Encounter: Payer: Self-pay | Admitting: Gastroenterology

## 2018-07-23 VITALS — BP 135/67 | HR 69 | Temp 98.6°F | Resp 18 | Ht 61.0 in | Wt 147.0 lb

## 2018-07-23 DIAGNOSIS — Z1211 Encounter for screening for malignant neoplasm of colon: Secondary | ICD-10-CM

## 2018-07-23 DIAGNOSIS — D123 Benign neoplasm of transverse colon: Secondary | ICD-10-CM | POA: Diagnosis not present

## 2018-07-23 MED ORDER — SODIUM CHLORIDE 0.9 % IV SOLN: 500.0000 mL | Freq: Once | INTRAVENOUS | Status: DC

## 2018-07-23 NOTE — Progress Notes (Signed)
Called to room to assist during endoscopic procedure.  Patient ID and intended procedure confirmed with present staff. Received instructions for my participation in the procedure from the performing physician.  

## 2018-07-23 NOTE — Progress Notes (Signed)
A/ox3, pleased with MAC, report to RN 

## 2018-07-23 NOTE — Op Note (Signed)
[place] Patient Name: Vanessa Nixon Procedure Date: 07/23/2018 11:15 AM MRN: [id number] Endoscopist: Mauri Pole , MD Age: 57 Referring MD:  Date of Birth: Nov 25, 1961 Gender: Female Account #: [id number] Procedure:                Colonoscopy Indications:              Screening for colorectal malignant neoplasm Medicines:                Monitored Anesthesia Care Procedure:                Pre-Anesthesia Assessment:                           - Prior to the procedure, a History and Physical                            was performed, and patient medications and                            allergies were reviewed. The patient's tolerance of                            previous anesthesia was also reviewed. The risks                            and benefits of the procedure and the sedation                            options and risks were discussed with the patient.                            All questions were answered, and informed consent                            was obtained. Prior Anticoagulants: The patient has                            taken no previous anticoagulant or antiplatelet                            agents. ASA Grade Assessment: II - A patient with                            mild systemic disease. After reviewing the risks                            and benefits, the patient was deemed in                            satisfactory condition to undergo the procedure.                           After obtaining informed consent, the colonoscope  was passed under direct vision. Throughout the                            procedure, the patient's blood pressure, pulse, and                            oxygen saturations were monitored continuously. The                            Model PCF-[id number]) scope was introduced                            through the anus and advanced to the the cecum,                            identified by  appendiceal orifice and ileocecal                            valve. The colonoscopy was performed without                            difficulty. The patient tolerated the procedure                            well. The quality of the bowel preparation was                            excellent. The ileocecal valve, appendiceal                            orifice, and rectum were photographed. Scope In: 11:28:58 AM Scope Out: 94:85:46 AM Scope Withdrawal Time: 0 hours 11 minutes 49 seconds  Total Procedure Duration: 0 hours 17 minutes 18 seconds  Findings:                 The perianal and digital rectal examinations were                            normal.                           A 2 mm polyp was found in the transverse colon. The                            polyp was sessile. The polyp was removed with a                            cold biopsy forceps. Resection and retrieval were                            complete.                           A few small-mouthed diverticula were found in the  sigmoid colon and descending colon.                           Non-bleeding internal hemorrhoids were found during                            retroflexion. The hemorrhoids were small. Complications:            No immediate complications. Estimated Blood Loss:     Estimated blood loss was minimal. Impression:               - One 2 mm polyp in the transverse colon, removed                            with a cold biopsy forceps. Resected and retrieved.                           - Diverticulosis in the sigmoid colon and in the                            descending colon.                           - Non-bleeding internal hemorrhoids. Recommendation:           - Patient has a contact number available for                            emergencies. The signs and symptoms of potential                            delayed complications were discussed with the                            patient.  Return to normal activities tomorrow.                            Written discharge instructions were provided to the                            patient.                           - Resume previous diet.                           - Continue present medications.                           - Await pathology results.                           - Repeat colonoscopy in 5-10 years for surveillance                            based on pathology results. Mauri Pole, MD 07/23/2018 11:56:40 AM This report has been signed electronically.

## 2018-07-23 NOTE — Patient Instructions (Signed)
YOU HAD AN ENDOSCOPIC PROCEDURE TODAY AT [facility]:   Refer to the procedure report that was given to you for any specific questions about what was found during the examination.  If the procedure report does not answer your questions, please call your gastroenterologist to clarify.  If you requested that your care partner not be given the details of your procedure findings, then the procedure report has been included in a sealed envelope for you to review at your convenience later.  YOU SHOULD EXPECT: Some feelings of bloating in the abdomen. Passage of more gas than usual.  Walking can help get rid of the air that was put into your GI tract during the procedure and reduce the bloating. If you had a lower endoscopy (such as a colonoscopy or flexible sigmoidoscopy) you may notice spotting of blood in your stool or on the toilet paper. If you underwent a bowel prep for your procedure, you may not have a normal bowel movement for a few days.  Please Note:  You might notice some irritation and congestion in your nose or some drainage.  This is from the oxygen used during your procedure.  There is no need for concern and it should clear up in a day or so.  SYMPTOMS TO REPORT IMMEDIATELY:   Following lower endoscopy (colonoscopy or flexible sigmoidoscopy):  Excessive amounts of blood in the stool  Significant tenderness or worsening of abdominal pains  Swelling of the abdomen that is new, acute  Fever of 100F or higher  Please see handouts given to you on Polyps, Diverticulosis and Hemorrrhoids.  For urgent or emergent issues, a gastroenterologist can be reached at any hour by calling [phone].   DIET:  We do recommend a small meal at first, but then you may proceed to your regular diet.  Drink plenty of fluids but you should avoid alcoholic beverages for 24 hours.  ACTIVITY:  You should plan to take it easy for the rest of today and you should NOT DRIVE or use heavy  machinery until tomorrow (because of the sedation medicines used during the test).    FOLLOW UP: Our staff will call the number listed on your records the next business day following your procedure to check on you and address any questions or concerns that you may have regarding the information given to you following your procedure. If we do not reach you, we will leave a message.  However, if you are feeling well and you are not experiencing any problems, there is no need to return our call.  We will assume that you have returned to your regular daily activities without incident.  If any biopsies were taken you will be contacted by phone or by letter within the next 1-3 weeks.  Please call us at [phone] if you have not heard about the biopsies in 3 weeks.    SIGNATURES/CONFIDENTIALITY: You and/or your care partner have signed paperwork which will be entered into your electronic medical record.  These signatures attest to the fact that that the information above on your After Visit Summary has been reviewed and is understood.  Full responsibility of the confidentiality of this discharge information lies with you and/or your care-partner.  Thank you for letting us take care of your healthcare needs today.

## 2018-07-24 ENCOUNTER — Telehealth: Payer: Self-pay

## 2018-07-24 NOTE — Telephone Encounter (Signed)
  Follow up Call-  Call Hanz Winterhalter number 07/23/2018  Post procedure Call Quintasia Theroux phone  # [phone]  Permission to leave phone message Yes  Some recent data might be hidden     Patient questions:  Do you have a fever, pain , or abdominal swelling? No. Pain Score  0 *  Have you tolerated food without any problems? Yes.    Have you been able to return to your normal activities? Yes.    Do you have any questions about your discharge instructions: Diet   No. Medications  No. Follow up visit  No.  Do you have questions or concerns about your Care? No.  Actions: * If pain score is 4 or above: No action needed, pain <4.

## 2018-07-29 ENCOUNTER — Encounter: Payer: Self-pay | Admitting: Gastroenterology

## 2018-09-11 DIAGNOSIS — Z1231 Encounter for screening mammogram for malignant neoplasm of breast: Secondary | ICD-10-CM | POA: Diagnosis not present

## 2018-09-11 DIAGNOSIS — Z803 Family history of malignant neoplasm of breast: Secondary | ICD-10-CM | POA: Diagnosis not present

## 2018-09-11 LAB — HM MAMMOGRAPHY

## 2018-10-13 ENCOUNTER — Ambulatory Visit: Payer: 59 | Admitting: Physician Assistant

## 2018-10-13 ENCOUNTER — Encounter: Payer: Self-pay | Admitting: Physician Assistant

## 2018-10-13 ENCOUNTER — Other Ambulatory Visit: Payer: Self-pay

## 2018-10-13 VITALS — BP 131/70 | HR 80 | Temp 98.1°F | Resp 20 | Ht 61.0 in | Wt 141.0 lb

## 2018-10-13 DIAGNOSIS — L309 Dermatitis, unspecified: Secondary | ICD-10-CM | POA: Diagnosis not present

## 2018-10-13 DIAGNOSIS — IMO0002 Reserved for concepts with insufficient information to code with codable children: Secondary | ICD-10-CM

## 2018-10-13 DIAGNOSIS — M25512 Pain in left shoulder: Secondary | ICD-10-CM

## 2018-10-13 DIAGNOSIS — R229 Localized swelling, mass and lump, unspecified: Secondary | ICD-10-CM | POA: Diagnosis not present

## 2018-10-13 MED ORDER — CLOBETASOL PROPIONATE 0.05 % EX OINT: 1.0000 "application " | TOPICAL_OINTMENT | Freq: Two times a day (BID) | CUTANEOUS | 0 refills | Status: DC

## 2018-10-13 MED ORDER — MELOXICAM 7.5 MG PO TABS: 7.5000 mg | ORAL_TABLET | Freq: Every day | ORAL | 0 refills | Status: DC

## 2018-10-13 NOTE — Progress Notes (Signed)
Vanessa Nixon  MRN: [id number] DOB: [birth year]  Subjective:   Vanessa Nixon is a 57 y.o. female who presents with left shoulder pain. The symptoms began 6 weeks ago. Aggravating factors: no known event. Pain is located anterior shoulder. Discomfort is described as sharp/stabbing. Symptoms are exacerbated by reaching behind backand overhead activity. Evaluation to date: none. Therapy to date includes: nothing specific.  Since having pain she has noticed a mass in the back of the arm. It is not painful. No overlying erythema, warmth, tenderness, fever, chills, weight loss, numbness, and tingling.    Also needs refill for cortisone cream for hand eczema.  She did not get this filled at her last visit and is concerned the printed prescription has expired. Review of Systems  Per HPI  There are no active problems to display for this patient.   Current Outpatient Medications on File Prior to Visit  Medication Sig Dispense Refill  . clobetasol ointment (TEMOVATE) 3.00 % Apply 1 application topically 2 (two) times daily. 30 g 0  . IRON PO Take by mouth.    . Multiple Vitamins-Minerals (MULTIVITAMIN WITH MINERALS) tablet Take 1 tablet by mouth daily.     Current Facility-Administered Medications on File Prior to Visit  Medication Dose Route Frequency Provider Last Rate Last Dose  . 0.9 %  sodium chloride infusion  500 mL Intravenous Once Nandigam, Venia Minks, MD        Allergies  Allergen Reactions  . Codeine Itching     Objective:  BP 131/70   Pulse 80   Temp 98.1 F (36.7 C) (Oral)   Resp 20   Ht 5\' 1"  (1.549 m)   Wt 141 lb (64 kg)   LMP 09/09/2018   SpO2 95%   BMI 26.64 kg/m   Physical Exam  Constitutional: She is oriented to person, place, and time. She appears well-developed and well-nourished. No distress.  HENT:  Head: Normocephalic and atraumatic.  Eyes: Conjunctivae are normal.  Neck: Normal range of motion.  Cardiovascular:  Pulses:      Radial pulses are 2+ on  the right side, and 2+ on the left side.  Pulmonary/Chest: Effort normal.  Musculoskeletal:       Right shoulder: Normal.       Left shoulder: She exhibits decreased range of motion (with active ROM of abduction and external roatation and adduction and internal rotation, normal passive ROM ) and tenderness (with extension beyond trunk and with aduction and internal rotation). She exhibits no bony tenderness, no swelling, no spasm, normal pulse and normal strength.  Left shoulder exam:   Positive:  Apley Scratch Test with both abduction and external rotation and adduction and internal rotation.  Negative:  Neer's sign Luan Pulling' test Drop-arm test Cross-arm test Spurling's test Apprehension test Yergason test  Neurological: She is alert and oriented to person, place, and time.  Sensation of BUE intact.   Skin: Skin is warm and dry.     Psychiatric: She has a normal mood and affect.  Vitals reviewed.   Assessment and Plan :  1. Acute pain of left shoulder History and physical exam findings concerning for rotator cuff tendinopathy.  She is neurovascularly intact.  No bony abnormality palpated on exam.  No concern for adhesive capsulitis at this time.  Recommend rest, ice, daily for 1 to 2 weeks, and range of motion exercise as tolerated.  If no improvement after 2 weeks, return to office for further evaluation.  Will obtain plain film  at that time and consider referral to orthopedics or physical therapy. - meloxicam (MOBIC) 7.5 MG tablet; Take 1 tablet (7.5 mg total) by mouth daily.  Dispense: 30 tablet; Refill: 0  2. 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  DDX includes cyst versus fat necrosis versus benign/malignant tumor.  Will order ultrasound for further evaluation.  May need to proceed with MRI depending on ultrasound findings.  - [place] LT UPPER EXTREM LTD SOFT TISSUE NON VASCULAR; Future  3. Hand eczema Refills provided. - clobetasol ointment (TEMOVATE) 0.05 %;  Apply 1 application topically 2 (two) times daily.  Dispense: 30 g; Refill: 0  Tenna Delaine PA-C  [facility] 10/13/2018 3:56 PM

## 2018-10-13 NOTE — Patient Instructions (Addendum)
Start meloxicam daily x 2 weeks. Rest and apply ice to shoulder. After one week, may start range of motion exercises as tolerated. If no improvement after 2 weeks or if symptoms worsen, return to clinic.    If you have lab work done today you will be contacted with your lab results within the next 2 weeks.  If you have not heard from [place] then please contact us. The fastest way to get your results is to register for My Chart.  Rotator Cuff Tendinitis Rotator cuff tendinitis is inflammation of the tough, cord-like bands that connect muscle to bone (tendons) in the rotator cuff. The rotator cuff includes all of the muscles and tendons that connect the arm to the shoulder. The rotator cuff holds the head of the upper arm bone (humerus) in the cup (fossa) of the shoulder blade (scapula). This condition can lead to a long-lasting (chronic) tear. The tear may be partial or complete. What are the causes? This condition is usually caused by overusing the rotator cuff. What increases the risk? This condition is more likely to develop in athletes and workers who frequently use their shoulder or reach over their heads. This can include activities such as:  Tennis.  Baseball or softball.  Swimming.  Construction work.  Painting.  What are the signs or symptoms? Symptoms of this condition include:  Pain spreading (radiating) from the shoulder to the upper arm.  Swelling and tenderness in front of the shoulder.  Pain when reaching, pulling, or lifting the arm above the head.  Pain when lowering the arm from above the head.  Minor pain in the shoulder when resting.  Increased pain in the shoulder at night.  Difficulty placing the arm behind the back.  How is this diagnosed? This condition is diagnosed with a medical history and physical exam. Tests may also be done, including:  X-rays.  MRI.  Ultrasounds.  CT or MR arthrogram. During this test, a contrast material is injected and then  images are taken.  How is this treated? Treatment for this condition depends on the severity of the condition. In less severe cases, treatment may include:  Rest. This may be done with a sling that holds the shoulder still (immobilization). Your health care provider may also recommend avoiding activities that involve lifting your arm over your head.  Icing the shoulder.  Anti-inflammatory medicines, such as aspirin or ibuprofen.  In more severe cases, treatment may include:  Physical therapy.  Steroid injections.  Surgery.  Follow these instructions at home: If you have a sling:  Wear the sling as told by your health care provider. Remove it only as told by your health care provider.  Loosen the sling if your fingers tingle, become numb, or turn cold and blue.  Keep the sling clean.  If the sling is not waterproof, do not let it get wet. Remove it, if allowed, or cover it with a watertight covering when you take a bath or shower. Managing pain, stiffness, and swelling  If directed, put ice on the injured area. ? If you have a removable sling, remove it as told by your health care provider. ? Put ice in a plastic bag. ? Place a towel between your skin and the bag. ? Leave the ice on for 20 minutes, 2-3 times a day.  Move your fingers often to avoid stiffness and to lessen swelling.  Raise (elevate) the injured area above the level of your heart while you are lying down.  Find a comfortable sleeping position or sleep on a recliner, if available. Driving  Do not drive or use heavy machinery while taking prescription pain medicine.  Ask your health care provider when it is safe to drive if you have a sling on your arm. Activity  Rest your shoulder as told by your health care provider.  Return to your normal activities as told by your health care provider. Ask your health care provider what activities are safe for you.  Do any exercises or stretches as told by your  health care provider.  If you do repetitive overhead tasks, take small breaks in between and include stretching exercises as told by your health care provider. General instructions  Do not use any products that contain nicotine or tobacco, such as cigarettes and e-cigarettes. These can delay healing. If you need help quitting, ask your health care provider.  Take over-the-counter and prescription medicines only as told by your health care provider.  Keep all follow-up visits as told by your health care provider. This is important. Contact a health care provider if:  Your pain gets worse.  You have new pain in your arm, hands, or fingers.  Your pain is not relieved with medicine or does not get better after 6 weeks of treatment.  You have cracking sensations when moving your shoulder in certain directions.  You hear a snapping sound after using your shoulder, followed by severe pain and weakness. Get help right away if:  Your arm, hand, or fingers are numb or tingling.  Your arm, hand, or fingers are swollen or painful or they turn white or blue. Summary  Rotator cuff tendinitis is inflammation of the tough, cord-like bands that connect muscle to bone (tendons) in the rotator cuff.  This condition is usually caused by overusing the rotator cuff, which includes all of the muscles and tendons that connect the arm to the shoulder.  This condition is more likely to develop in athletes and workers who frequently use their shoulder or reach over their heads.  Treatment generally includes rest, anti-inflammatory medicines, and icing. In some cases, physical therapy and steroid injections may be needed. In severe cases, surgery may be needed. This information is not intended to replace advice given to you by your health care provider. Make sure you discuss any questions you have with your health care provider. Document Released: 02/08/2004 Document Revised: 11/04/2016 Document Reviewed:  11/04/2016 Elsevier Interactive Patient Education  2017 [employer] Capsulitis Adhesive capsulitis is inflammation of the tendons and ligaments that surround the shoulder joint (shoulder capsule). This condition causes the shoulder to become stiff and painful to move. Adhesive capsulitis is also called frozen shoulder. What are the causes? This condition may be caused by:  An injury to the shoulder joint.  Straining the shoulder.  Not moving the shoulder for a period of time. This can happen if your arm was injured or in a sling.  Long-standing health problems, such as: ? Diabetes. ? Thyroid problems. ? Heart disease. ? Stroke. ? Rheumatoid arthritis. ? Lung disease.  In some cases, the cause may not be known. What increases the risk? This condition is more likely to develop in:  Women.  People who are older than 57 years of age.  What are the signs or symptoms? Symptoms of this condition include:  Pain in the shoulder when moving the arm. There may also be pain when parts of the shoulder are touched. The pain is worse at night  or when at rest.  Soreness or aching in the shoulder.  Inability to move the shoulder normally.  Muscle spasms.  How is this diagnosed? This condition is diagnosed with a physical exam and imaging tests, such as an X-ray or MRI. How is this treated? This condition may be treated with:  Treatment of the underlying cause or condition.  Physical therapy. This involves performing exercises to get the shoulder moving again.  Medicine. Medicine may be given to relieve pain, inflammation, or muscle spasms.  Steroid injections into the shoulder joint.  Shoulder manipulation. This is a procedure to move the shoulder into another position. It is done after you are given a medicine to make you fall asleep (general anesthetic). The joint may also be injected with salt water at high pressure to break down scarring.  Surgery. This may  be done in severe cases when other treatments have failed.  Although most people recover completely from adhesive capsulitis, some may not regain the full movement of the shoulder. Follow these instructions at home:  Take over-the-counter and prescription medicines only as told by your health care provider.  If you are being treated with physical therapy, follow instructions from your physical therapist.  Avoid exercises that put a lot of demand on your shoulder, such as throwing. These exercises can make pain worse.  If directed, apply ice to the injured area: ? Put ice in a plastic bag. ? Place a towel between your skin and the bag. ? Leave the ice on for 20 minutes, 2-3 times per day. Contact a health care provider if:  You develop new symptoms.  Your symptoms get worse. This information is not intended to replace advice given to you by your health care provider. Make sure you discuss any questions you have with your health care provider. Document Released: 09/15/2009 Document Revised: 04/25/2016 Document Reviewed: 03/13/2015 Elsevier Interactive Patient Education  2018 [employer].  IF you received an x-ray today, you will receive an invoice from [facility]. Please contact [facility] at [phone] with questions or concerns regarding your invoice.   IF you received labwork today, you will receive an invoice from [place]. Please contact [facility] at [phone] with questions or concerns regarding your invoice.   Our billing staff will not be able to assist you with questions regarding bills from these companies.  You will be contacted with the lab results as soon as they are available. The fastest way to get your results is to activate your My Chart account. Instructions are located on the last page of this paperwork. If you have not heard from [place] regarding the results in 2 weeks, please contact this office.     Rotator Cuff Tear Rehab After Surgery Ask  your health care provider which exercises are safe for you. Do exercises exactly as told by your health care provider and adjust them as directed. It is normal to feel mild stretching, pulling, tightness, or discomfort as you do these exercises, but you should stop right away if you feel sudden pain or your pain gets worse. Do not begin these exercises until told by your health care provider. Stretching and range of motion exercises These exercises warm up your muscles and joints and improve the movement and flexibility of your shoulder. These exercises also help to relieve pain, numbness, and tingling. Exercise A: Pendulum  1. Stand near a wall or a surface that you can hold onto for balance. 2. Bend at the waist and let your left /  right arm hang straight down. Use your other arm to keep your balance. 3. Relax your arm and shoulder muscles, and move your hips and your trunk so your left / right arm swings freely. Your arm should swing because of the motion of your body, not because you are using your arm or shoulder muscles. 4. Keep moving so your arm swings in the following directions, as told by your health care provider: ? Side to side. ? Forward and backward. ? In clockwise and counterclockwise circles. Repeat __________ times, or for __________ seconds per direction. Complete this exercise __________ times a day. Exercise B: Flexion, seated  1. Sit in a stable chair so your left / right forearm can rest on a flat surface. Your elbow should rest at a height that keeps your upper arm next to your body. 2. Keeping your shoulder relaxed, lean forward at the waist and let your hand slide forward. Stop when you feel a stretch in your shoulder, or when you reach the angle that is recommended by your health care provider. 3. Hold for __________ seconds. 4. Slowly return to the starting position. Repeat __________ times. Complete this exercise __________ times a day. Exercise C: Flexion,  standing  1. Stand and hold a broomstick, a cane, or a similar object. Place your hands a little more than shoulder-width apart on the object. Your left / right hand should be palm-up, and your other hand should be palm-down. 2. Push the stick down with your healthy arm to raise your left / right arm in front of your body, and then over your head. Use your other hand to help move the stick. Stop when you feel a stretch in your shoulder, or when you reach the angle that is recommended by your health care provider. ? Avoid shrugging your shoulder while you raise your arm. Keep your shoulder blade tucked down toward your spine. ? Keep your left / right shoulder muscles relaxed. 3. Hold for __________ seconds. 4. Slowly return to the starting position. Repeat __________ times. Complete this exercise __________ times a day. Exercise D: Abduction, supine  1. Lie on your back and hold a broomstick, a cane, or a similar object. Place your hands a little more than shoulder-width apart on the object. Your left / right hand should be palm-up, and your other hand should be palm-down. 2. Push the stick to raise your left / right arm out to your side and then over your head. Use your other hand to help move the stick. Stop when you feel a stretch in your shoulder, or when you reach the angle that is recommended by your health care provider. ? Avoid shrugging your shoulder while you raise your arm. Keep your shoulder blade tucked down toward your spine. 3. Hold for __________ seconds. 4. Slowly return to the starting position. Repeat __________ times. Complete this exercise __________ times a day. Exercise E: Shoulder flexion, active-assisted  1. Lie on your back. You may bend your knees for comfort. 2. Hold a broomstick, a cane, or a similar object so your hands are about shoulder-width apart. Your palms should face toward your feet. 3. Raise your left / right arm over your head and behind your head, toward  the floor. Use your other hand to help you do this. Stop when you feel a gentle stretch in your shoulder, or when you reach the angle that is recommended by your health care provider. 4. Hold for __________ seconds. 5. Use the broomstick and  your other arm to help you return your left / right arm to the starting position. Repeat __________ times. Complete this exercise __________ times a day. Exercise F: External rotation  1. Sit in a stable chair without armrests, or stand. 2. Tuck a soft object, such as a folded towel or a small ball, under your left / right upper arm. 3. Hold a broomstick, a cane, or a similar object so your palms face down, toward the floor. Bend your elbows to an "L" shape (90 degrees), and keep your hands about shoulder-width apart. 4. Straighten your healthy arm and push the broomstick across your body, toward your left / right side. Keep your left / right arm bent. This will rotate your left / right forearm away from your body. 5. Hold for __________ seconds. 6. Slowly return to the starting position. Repeat __________ times. Complete this exercise __________ times a day. Strengthening exercises These exercises build strength and endurance in your shoulder. Endurance is the ability to use your muscles for a long time, even after they get tired. Exercise G: Shoulder flexion, isometric  1. Stand or sit about 4-6 inches (10-15 cm) away from a wall with your left / right side facing the wall. 2. Gently make a fist and place your left / right hand on the wall so the top of your fist touches the wall. 3. With your left / right elbow straight, gently press the top of your fist into the wall. Gradually increase the pressure until you are pressing as hard as you can without shrugging your shoulder. 4. Hold for __________ seconds. 5. Slowly release the tension and relax your muscles completely before you repeat the exercise. Repeat __________ times. Complete this exercise  __________ times a day. Exercise H: Shoulder abduction, isometric  1. Stand or sit about 4-6 inches (10-15 cm) away from a wall with your right/left side facing the wall. 2. Bend your left / right elbow and gently press your elbow into the wall as if you are trying to move your arm out to your side. Increase the pressure gradually until you are pressing as hard as you can without shrugging your shoulder. 3. Hold for __________ seconds. 4. Slowly release the tension and relax your muscles completely before repeating the exercise. Repeat __________ times. Complete this exercise __________ times a day. Exercise I: Internal rotation, isometric  1. Stand or sit in a doorway, facing the door frame. 2. Bend your left / right elbow and place the palm of your hand against the door frame. Only your palm should be touching the frame. Keep your upper arm at your side. 3. Gently press your hand into the door frame, as if you are trying to push your arm toward your abdomen. Do not let your wrist bend. ? Avoid shrugging your shoulder while you press your hand into the door frame. Keep your shoulder blade tucked down toward the middle of your back. 4. Hold for __________ seconds. 5. Slowly release the tension, and relax your muscles completely before you repeat the exercise. Repeat __________ times. Complete this exercise __________ times a day. Exercise J: External rotation, isometric  1. Stand or sit in a doorway, facing the door frame. 2. Bend your left / right elbow and place the back of your wrist against the door frame. Only the back of your wrist should be touching the frame. Keep your upper arm at your side. 3. Gently press your wrist against the door frame, as if you are  trying to push your arm away from your abdomen. ? Avoid shrugging your shoulder while you press your wrist into the door frame. Keep your shoulder blade tucked down toward the middle of your back. 4. Hold for __________  seconds. 5. Slowly release the tension, and relax your muscles completely before you repeat the exercise. Repeat __________ times. Complete this exercise __________ times a day. This information is not intended to replace advice given to you by your health care provider. Make sure you discuss any questions you have with your health care provider. Document Released: 11/18/2005 Document Revised: 07/25/2016 Document Reviewed: 12/02/2015 Elsevier Interactive Patient Education  [employer].

## 2018-10-20 ENCOUNTER — Ambulatory Visit
Admission: RE | Admit: 2018-10-20 | Discharge: 2018-10-20 | Disposition: A | Discharge status: Home / Self Care | Payer: 59 | Source: Ambulatory Visit | Attending: Physician Assistant | Admitting: Physician Assistant

## 2018-10-20 DIAGNOSIS — IMO0002 Reserved for concepts with insufficient information to code with codable children: Secondary | ICD-10-CM

## 2018-10-20 DIAGNOSIS — R229 Localized swelling, mass and lump, unspecified: Principal | ICD-10-CM

## 2018-10-20 DIAGNOSIS — R2232 Localized swelling, mass and lump, left upper limb: Secondary | ICD-10-CM | POA: Diagnosis not present

## 2018-10-21 ENCOUNTER — Other Ambulatory Visit: Payer: Self-pay | Admitting: Physician Assistant

## 2018-10-21 DIAGNOSIS — R229 Localized swelling, mass and lump, unspecified: Principal | ICD-10-CM

## 2018-10-21 DIAGNOSIS — IMO0002 Reserved for concepts with insufficient information to code with codable children: Secondary | ICD-10-CM

## 2018-10-21 NOTE — Progress Notes (Signed)
Orders Placed This Encounter  Procedures  . MR HUMERUS LEFT W WO CONTRAST    Standing Status:   Future    Standing Expiration Date:   12/22/2019    Order Specific Question:   ** REASON FOR EXAM (FREE TEXT)    Answer:   IMPRESSION: 1. 3.4 x 2.1 x 4.3 cm soft tissue mass noted on [place]. Recommended MR imaging without and with contrast    Order Specific Question:   If indicated for the ordered procedure, I authorize the administration of contrast media per Radiology protocol    Answer:   Yes    Order Specific Question:   What is the patient's sedation requirement?    Answer:   Oral Sedation    Order Specific Question:   Does the patient have a pacemaker or implanted devices?    Answer:   No    Order Specific Question:   Radiology Contrast Protocol - do NOT remove file path    Answer:   [email or web address]    Order Specific Question:   Preferred imaging location?    Answer:   [facility] (table limit-550lbs)

## 2018-10-28 ENCOUNTER — Encounter: Payer: Self-pay | Admitting: Physician Assistant

## 2018-11-17 ENCOUNTER — Ambulatory Visit
Admission: RE | Admit: 2018-11-17 | Discharge: 2018-11-17 | Disposition: A | Discharge status: Home / Self Care | Payer: 59 | Source: Ambulatory Visit | Attending: Physician Assistant | Admitting: Physician Assistant

## 2018-11-17 ENCOUNTER — Telehealth: Payer: Self-pay | Admitting: Family Medicine

## 2018-11-17 DIAGNOSIS — R229 Localized swelling, mass and lump, unspecified: Principal | ICD-10-CM

## 2018-11-17 DIAGNOSIS — IMO0002 Reserved for concepts with insufficient information to code with codable children: Secondary | ICD-10-CM

## 2018-11-17 MED ORDER — ALPRAZOLAM 0.5 MG PO TABS: 0.5000 mg | ORAL_TABLET | Freq: Two times a day (BID) | ORAL | 0 refills | Status: DC | PRN

## 2018-11-17 NOTE — Telephone Encounter (Signed)
Copied from [place] [id number]. Topic: General - Inquiry >> Nov 17, 2018 12:01 PM Vernona Rieger wrote: Reason for CRM: Patient states she went to have her MRI done today (ordered by [place]) and said she could not do it, she thinks she is claustrophobic. She is requesting to have something to ease her or go to an open MRI machine. She would like to get it done before the end of the year due to insurance.

## 2018-11-17 NOTE — Addendum Note (Signed)
Addended by: Rutherford Guys on: 11/17/2018 08:41 PM   Modules accepted: Orders

## 2018-11-17 NOTE — Telephone Encounter (Signed)
Please let patient know that I sent a prescription for xanax. She needs to reschedule her MRI. thanks

## 2018-11-19 NOTE — Telephone Encounter (Signed)
Left a msg on patient machine a prescription for xanax was sent into her pharmacy. She needs to reschedule her MRI.

## 2018-11-30 ENCOUNTER — Ambulatory Visit (INDEPENDENT_AMBULATORY_CARE_PROVIDER_SITE_OTHER): Payer: Self-pay | Admitting: Physician Assistant

## 2018-11-30 VITALS — BP 124/70 | HR 65 | Temp 99.4°F | Resp 20 | Wt 145.0 lb

## 2018-11-30 DIAGNOSIS — J111 Influenza due to unidentified influenza virus with other respiratory manifestations: Secondary | ICD-10-CM

## 2018-11-30 DIAGNOSIS — R52 Pain, unspecified: Secondary | ICD-10-CM

## 2018-11-30 DIAGNOSIS — R69 Illness, unspecified: Secondary | ICD-10-CM

## 2018-11-30 MED ORDER — BENZONATATE 100 MG PO CAPS: 100.0000 mg | ORAL_CAPSULE | Freq: Three times a day (TID) | ORAL | 0 refills | Status: DC | PRN

## 2018-11-30 MED ORDER — PROMETHAZINE-DM 6.25-15 MG/5ML PO SYRP: 5.0000 mL | ORAL_SOLUTION | Freq: Four times a day (QID) | ORAL | 0 refills | Status: DC | PRN

## 2018-11-30 NOTE — Patient Instructions (Signed)
Your history is consistent with a flu like illness. You are contagious until you are fever free for 24 hours without using tylenol or ibuprofen. Please use ibuprofen 400-600 mg every 6-8 hours. Use tessalon perlse during the day to stop the cough and cough syrup at time. Two major complications after the flu are pneumonia and sinus infections. Please be aware of this and if you are not any better in 7-10 days or you develop worsening cough or sinus pressure, seek care at our clinic or the ED. Continue to wash your hands and wear a mask daily especially around other people.   Influenza, Adult Influenza is also called "the flu." It is an infection in the lungs, nose, and throat (respiratory tract). It is caused by a virus. The flu causes symptoms that are similar to symptoms of a cold. It also causes a high fever and body aches. The flu spreads easily from person to person (is contagious). Getting a flu shot (influenza vaccination) every year is the best way to prevent the flu. What are the causes? This condition is caused by the influenza virus. You can get the virus by:  Breathing in droplets that are in the air from the cough or sneeze of a person who has the virus.  Touching something that has the virus on it (is contaminated) and then touching your mouth, nose, or eyes. What increases the risk? Certain things may make you more likely to get the flu. These include:  Not washing your hands often.  Having close contact with many people during cold and flu season.  Touching your mouth, eyes, or nose without first washing your hands.  Not getting a flu shot every year. You may have a higher risk for the flu, along with serious problems such as a lung infection (pneumonia), if you:  Are older than 65.  Are pregnant.  Have a weakened disease-fighting system (immune system) because of a disease or taking certain medicines.  Have a long-term (chronic) illness, such as: ? Heart, kidney, or  lung disease. ? Diabetes. ? Asthma.  Have a liver disorder.  Are very overweight (morbidly obese).  Have anemia. This is a condition that affects your red blood cells. What are the signs or symptoms? Symptoms usually begin suddenly and last 4-14 days. They may include:  Fever and chills.  Headaches, body aches, or muscle aches.  Sore throat.  Cough.  Runny or stuffy (congested) nose.  Chest discomfort.  Not wanting to eat as much as normal (poor appetite).  Weakness or feeling tired (fatigue).  Dizziness.  Feeling sick to your stomach (nauseous) or throwing up (vomiting). How is this treated? If the flu is found early, you can be treated with medicine that can help reduce how bad the illness is and how long it lasts (antiviral medicine). This may be given by mouth (orally) or through an IV tube. Taking care of yourself at home can help your symptoms get better. Your doctor may suggest:  Taking over-the-counter medicines.  Drinking plenty of fluids. The flu often goes away on its own. If you have very bad symptoms or other problems, you may be treated in a hospital. Follow these instructions at home:     Activity  Rest as needed. Get plenty of sleep.  Stay home from work or school as told by your doctor. ? Do not leave home until you do not have a fever for 24 hours without taking medicine. ? Leave home only to visit your  doctor. Eating and drinking  Take an ORS (oral rehydration solution). This is a drink that is sold at pharmacies and stores.  Drink enough fluid to keep your pee (urine) pale yellow.  Drink clear fluids in small amounts as you are able. Clear fluids include: ? Water. ? Ice chips. ? Fruit juice that has water added (diluted fruit juice). ? Low-calorie sports drinks.  Eat bland, easy-to-digest foods in small amounts as you are able. These foods include: ? Bananas. ? Applesauce. ? Rice. ? Lean meats. ? Toast. ? Crackers.  Do not eat  or drink: ? Fluids that have a lot of sugar or caffeine. ? Alcohol. ? Spicy or fatty foods. General instructions  Take over-the-counter and prescription medicines only as told by your doctor.  Use a cool mist humidifier to add moisture to the air in your home. This can make it easier for you to breathe.  Cover your mouth and nose when you cough or sneeze.  Wash your hands with soap and water often, especially after you cough or sneeze. If you cannot use soap and water, use alcohol-based hand sanitizer.  Keep all follow-up visits as told by your doctor. This is important. How is this prevented?   Get a flu shot every year. You may get the flu shot in late summer, fall, or winter. Ask your doctor when you should get your flu shot.  Avoid contact with people who are sick during fall and winter (cold and flu season). Contact a doctor if:  You get new symptoms.  You have: ? Chest pain. ? Watery poop (diarrhea). ? A fever.  Your cough gets worse.  You start to have more mucus.  You feel sick to your stomach.  You throw up. Get help right away if you:  Have shortness of breath.  Have trouble breathing.  Have skin or nails that turn a bluish color.  Have very bad pain or stiffness in your neck.  Get a sudden headache.  Get sudden pain in your face or ear.  Cannot eat or drink without throwing up. Summary  Influenza ("the flu") is an infection in the lungs, nose, and throat. It is caused by a virus.  Take over-the-counter and prescription medicines only as told by your doctor.  Getting a flu shot every year is the best way to avoid getting the flu. This information is not intended to replace advice given to you by your health care provider. Make sure you discuss any questions you have with your health care provider. Document Released: 08/27/2008 Document Revised: 05/06/2018 Document Reviewed: 05/06/2018 Elsevier Interactive Patient Education  2019 [employer].

## 2018-11-30 NOTE — Progress Notes (Signed)
MRN: [id number] DOB: Feb 09, 1961  Subjective:   Vanessa Nixon is a 57 y.o. female presenting for chief complaint of Achy, cough,feverx4(Choice) .  Reports sudden onset body aches, fever, and dry cough 4 days ago. Has been taking aspriin and one ibuprofen every 4 hours. Fever comes and goes. Denies SOB, chest pain, productive cough, nasal congestion, sore throat, ear pain, N/V/D. Sick exposure to son with similar symptoms. No comordities. Had flu shot this year. Denies smoking. . Denies any other aggravating or relieving factors, no other questions or concerns.  Review of Systems  Constitutional: Positive for malaise/fatigue. Negative for diaphoresis.  HENT: Negative for congestion, ear pain and sinus pain.   Respiratory: Negative for hemoptysis and shortness of breath.   Gastrointestinal: Negative for abdominal pain.  Skin: Negative for rash.  Neurological: Negative for dizziness and headaches.    Vanessa Nixon has a current medication list which includes the following prescription(s): iron, multivitamin with minerals, alprazolam, benzonatate, clobetasol ointment, meloxicam, and promethazine-dextromethorphan, and the following Facility-Administered Medications: sodium chloride. Also is allergic to codeine.  Vanessa Nixon  has a past medical history of Allergy, Anemia, and Arthritis. Also  has a past surgical history that includes Cesarean section; Colonoscopy; and Wisdom tooth extraction.   Objective:   Vitals: BP 124/70 (BP Location: Right Arm, Patient Position: Prone, Cuff Size: Normal)   Pulse 65   Temp 99.4 F (37.4 C) (Oral)   Resp 20   Wt 145 lb (65.8 kg)   SpO2 99%   BMI 27.40 kg/m   Physical Exam Vitals signs reviewed.  Constitutional:      General: She is not in acute distress.    Appearance: She is well-developed. She is not toxic-appearing.     Comments: Appears like she does not feel well, NAD.   HENT:     Head: Normocephalic and atraumatic.     Right Ear: Tympanic membrane, ear  canal and external ear normal.     Left Ear: Tympanic membrane, ear canal and external ear normal.     Nose: No mucosal edema.     Right Sinus: No maxillary sinus tenderness or frontal sinus tenderness.     Left Sinus: No maxillary sinus tenderness or frontal sinus tenderness.     Mouth/Throat:     Pharynx: Oropharynx is clear. Uvula midline. No posterior oropharyngeal erythema.     Tonsils: No tonsillar exudate or tonsillar abscesses.  Eyes:     Conjunctiva/sclera: Conjunctivae normal.  Neck:     Musculoskeletal: Normal range of motion.  Cardiovascular:     Rate and Rhythm: Normal rate and regular rhythm.     Heart sounds: Normal heart sounds.  Pulmonary:     Effort: Pulmonary effort is normal.     Breath sounds: Normal breath sounds. No decreased breath sounds, wheezing, rhonchi or rales.  Lymphadenopathy:     Head:     Right side of head: No submental, submandibular, tonsillar, preauricular, posterior auricular or occipital adenopathy.     Left side of head: No submental, submandibular, tonsillar, preauricular, posterior auricular or occipital adenopathy.     Cervical: No cervical adenopathy.     Upper Body:     Right upper body: No supraclavicular adenopathy.     Left upper body: No supraclavicular adenopathy.  Skin:    General: Skin is warm and dry.  Neurological:     Mental Status: She is alert and oriented to person, place, and time.     Results for orders placed or performed in visit  on 11/30/18 (from the past 24 hour(s))  POCT Influenza A/B     Status: Normal   Collection Time: 12/01/18  8:41 AM  Result Value Ref Range   Influenza A, POC Negative Negative   Influenza B, POC Negative Negative    Assessment and Plan :  1. Influenza-like illness Patient appears like she does not feel well but is not in acute distress.  VSS.  Suspect influenza despite negative point-of-care testing.  She is outside the treatment window for Tamiflu and is overall at low risk for serious  complications of influenza.  Recommend symptomatic treatment at this time.  Rest, oral hydration, eat light meals.  Increase over-the-counter ibuprofen to therapeutic dose as prescribed.  May use Tessalon Perles and Promethazine DM cough syrup for cough.  Educated on potential complications of the flu.  Encouraged to follow-up with family doctor if no improvement in 5 to 7 days or seek care at urgent care or ED if symptoms worsen or she develops new concerning symptoms or treatment plan. - benzonatate (TESSALON) 100 MG capsule; Take 1-2 capsules (100-200 mg total) by mouth 3 (three) times daily as needed for cough.  Dispense: 40 capsule; Refill: 0 - promethazine-dextromethorphan (PROMETHAZINE-DM) 6.25-15 MG/5ML syrup; Take 5 mLs by mouth 4 (four) times daily as needed for cough.  Dispense: 118 mL; Refill: 0  2. Body aches - POCT Influenza A/B   Tenna Delaine, PA-C  [facility] 12/01/2018 8:41 AM

## 2018-12-01 ENCOUNTER — Other Ambulatory Visit: Payer: 59

## 2018-12-01 LAB — POCT INFLUENZA A/B
INFLUENZA B, POC: NEGATIVE
Influenza A, POC: NEGATIVE

## 2018-12-03 ENCOUNTER — Ambulatory Visit: Payer: 59 | Admitting: Emergency Medicine

## 2018-12-03 ENCOUNTER — Ambulatory Visit (INDEPENDENT_AMBULATORY_CARE_PROVIDER_SITE_OTHER): Payer: 59

## 2018-12-03 ENCOUNTER — Encounter: Payer: Self-pay | Admitting: Emergency Medicine

## 2018-12-03 VITALS — BP 146/72 | HR 103 | Temp 98.9°F | Resp 17 | Ht 61.0 in | Wt 145.0 lb

## 2018-12-03 DIAGNOSIS — R05 Cough: Secondary | ICD-10-CM

## 2018-12-03 DIAGNOSIS — J159 Unspecified bacterial pneumonia: Secondary | ICD-10-CM

## 2018-12-03 DIAGNOSIS — J181 Lobar pneumonia, unspecified organism: Secondary | ICD-10-CM | POA: Diagnosis not present

## 2018-12-03 DIAGNOSIS — R059 Cough, unspecified: Secondary | ICD-10-CM

## 2018-12-03 MED ORDER — CEFTRIAXONE SODIUM 1 G IJ SOLR
1.0000 g | Freq: Once | INTRAMUSCULAR | Status: AC
  Administered 2018-12-03: 1 g via INTRAMUSCULAR

## 2018-12-03 MED ORDER — AZITHROMYCIN 250 MG PO TABS: ORAL_TABLET | ORAL | 0 refills | Status: DC

## 2018-12-03 MED ORDER — AMOXICILLIN-POT CLAVULANATE 875-125 MG PO TABS: 1.0000 | ORAL_TABLET | Freq: Two times a day (BID) | ORAL | 0 refills | Status: AC

## 2018-12-03 NOTE — Patient Instructions (Signed)
Community-Acquired Pneumonia, Adult  Pneumonia is an infection of the lungs. It causes swelling in the airways of the lungs. Mucus and fluid may also build up inside the airways.  One type of pneumonia can happen while a person is in a hospital. A different type can happen when a person is not in a hospital (community-acquired pneumonia).   What are the causes?    This condition is caused by germs (viruses, bacteria, or fungi). Some types of germs can be passed from one person to another. This can happen when you breathe in droplets from the cough or sneeze of an infected person.  What increases the risk?  You are more likely to develop this condition if you:   Have a long-term (chronic) disease, such as:  ? Chronic obstructive pulmonary disease (COPD).  ? Asthma.  ? Cystic fibrosis.  ? Congestive heart failure.  ? Diabetes.  ? Kidney disease.   Have HIV.   Have sickle cell disease.   Have had your spleen removed.   Do not take good care of your teeth and mouth (poor dental hygiene).   Have a medical condition that increases the risk of breathing in droplets from your own mouth and nose.   Have a weakened body defense system (immune system).   Are a smoker.   Travel to areas where the germs that cause this illness are common.   Are around certain animals or the places they live.  What are the signs or symptoms?   A dry cough.   A wet (productive) cough.   Fever.   Sweating.   Chest pain. This often happens when breathing deeply or coughing.   Fast breathing or trouble breathing.   Shortness of breath.   Shaking chills.   Feeling tired (fatigue).   Muscle aches.  How is this treated?  Treatment for this condition depends on many things. Most adults can be treated at home. In some cases, treatment must happen in a hospital. Treatment may include:   Medicines given by mouth or through an IV tube.   Being given extra oxygen.   Respiratory therapy.  In rare cases, treatment for very bad pneumonia  may include:   Using a machine to help you breathe.   Having a procedure to remove fluid from around your lungs.  Follow these instructions at home:  Medicines   Take over-the-counter and prescription medicines only as told by your doctor.  ? Only take cough medicine if you are losing sleep.   If you were prescribed an antibiotic medicine, take it as told by your doctor. Do not stop taking the antibiotic even if you start to feel better.  General instructions     Sleep with your head and neck raised (elevated). You can do this by sleeping in a recliner or by putting a few pillows under your head.   Rest as needed. Get at least 8 hours of sleep each night.   Drink enough water to keep your pee (urine) pale yellow.   Eat a healthy diet that includes plenty of vegetables, fruits, whole grains, low-fat dairy products, and lean protein.   Do not use any products that contain nicotine or tobacco. These include cigarettes, e-cigarettes, and chewing tobacco. If you need help quitting, ask your doctor.   Keep all follow-up visits as told by your doctor. This is important.  How is this prevented?  A shot (vaccine) can help prevent pneumonia. Shots are often suggested for:   People   older than 58 years of age.   People older than 58 years of age who:  ? Are having cancer treatment.  ? Have long-term (chronic) lung disease.  ? Have problems with their body's defense system.  You may also prevent pneumonia if you take these actions:   Get the flu (influenza) shot every year.   Go to the dentist as often as told.   Wash your hands often. If you cannot use soap and water, use hand sanitizer.  Contact a doctor if:   You have a fever.   You lose sleep because your cough medicine does not help.  Get help right away if:   You are short of breath and it gets worse.   You have more chest pain.   Your sickness gets worse. This is very serious if:  ? You are an older adult.  ? Your body's defense system is weak.   You  cough up blood.  Summary   Pneumonia is an infection of the lungs.   Most adults can be treated at home. Some will need treatment in a hospital.   Drink enough water to keep your pee pale yellow.   Get at least 8 hours of sleep each night.  This information is not intended to replace advice given to you by your health care provider. Make sure you discuss any questions you have with your health care provider.  Document Released: 05/06/2008 Document Revised: 07/16/2018 Document Reviewed: 07/16/2018  Elsevier Interactive Patient Education  2019 [employer]

## 2018-12-03 NOTE — Progress Notes (Signed)
Anselm Pancoast 58 y.o.   Chief Complaint  Patient presents with  . Cough  . Wheezing  . Generalized Body Aches    HISTORY OF PRESENT ILLNESS: This is a 58 y.o. female complaining of flu like symptoms that started last Friday evening, slowly progressed next several days with generalized body aches, cough, and wheezing.  Was seen at [facility] 3 days ago and had flu test done which was negative.  Started on cough medicine, Tessalon and Phenergan DM.  Mild relief.  No new symptoms, still feeling sick.  HPI   Prior to Admission medications   Medication Sig Start Date End Date Taking? Authorizing Provider  benzonatate (TESSALON) 100 MG capsule Take 1-2 capsules (100-200 mg total) by mouth 3 (three) times daily as needed for cough. 11/30/18  Yes Timmothy Euler, [place] D, PA-C  clobetasol ointment (TEMOVATE) 3.81 % Apply 1 application topically 2 (two) times daily. 10/13/18  Yes Timmothy Euler, [place] D, PA-C  IRON PO Take by mouth.   Yes [provider]  Multiple Vitamins-Minerals (MULTIVITAMIN WITH MINERALS) tablet Take 1 tablet by mouth daily.   Yes [provider]  promethazine-dextromethorphan (PROMETHAZINE-DM) 6.25-15 MG/5ML syrup Take 5 mLs by mouth 4 (four) times daily as needed for cough. 11/30/18  Yes Timmothy Euler, [place] D, PA-C  ALPRAZolam Duanne Moron) 0.5 MG tablet Take 1 tablet (0.5 mg total) by mouth 2 (two) times daily as needed for anxiety. Take 90-60 minutes before MRI. Can repeat dose 30 minutes before MRI if needed Patient not taking: Reported on 11/30/2018 11/17/18   Rutherford Guys, MD  meloxicam (MOBIC) 7.5 MG tablet Take 1 tablet (7.5 mg total) by mouth daily. Patient not taking: Reported on 11/30/2018 10/13/18   Tenna Delaine D, PA-C    Allergies  Allergen Reactions  . Codeine Itching    There are no active problems to display for this patient.   Past Medical History:  Diagnosis Date  . Allergy   . Anemia   . Arthritis     Past Surgical History:    Procedure Laterality Date  . CESAREAN SECTION    . COLONOSCOPY     30 plus years ago  . WISDOM TOOTH EXTRACTION      Social History   Socioeconomic History  . Marital status: Married    Spouse name: Not on file  . Number of children: 5  . Years of education: Not on file  . Highest education level: Bachelor's degree (e.g., BA, AB, BS)  Occupational History  . Not on file  Social Needs  . Financial resource strain: Not hard at all  . Food insecurity:    Worry: Never true    Inability: Never true  . Transportation needs:    Medical: No    Non-medical: No  Tobacco Use  . Smoking status: Never Smoker  . Smokeless tobacco: Never Used  Substance and Sexual Activity  . Alcohol use: Never    Alcohol/week: 0.0 standard drinks    Frequency: Never  . Drug use: No  . Sexual activity: Yes    Partners: Male    Birth control/protection: Condom    Comment: with monogamous husband-1st intercourse 19 yo-Fewer than 5 partners  Lifestyle  . Physical activity:    Days per week: 0 days    Minutes per session: 0 min  . Stress: Only a little  Relationships  . Social connections:    Talks on phone: More than three times a week    Gets together: Once a week  Attends religious service: More than 4 times per year    Active member of club or organization: Yes    Attends meetings of clubs or organizations: More than 4 times per year    Relationship status: Married  . Intimate partner violence:    Fear of current or ex partner: No    Emotionally abused: No    Physically abused: No    Forced sexual activity: No  Other Topics Concern  . Not on file  Social History Narrative   Pt is from [place]. Works at [employer] with [employer]. Lives at home with husband and 4 of 5 children.     Family History  Problem Relation Age of Onset  . COPD Mother   . Arthritis Mother   . Breast cancer Mother 61  . Alzheimer's disease Father   . Cancer Father        Prostate,Multiple myloma  .  Cancer Sister        Liver  . Breast cancer Sister 50  . Colon cancer Paternal Aunt   . Colon polyps Neg Hx   . Esophageal cancer Neg Hx   . Rectal cancer Neg Hx   . Stomach cancer Neg Hx      Review of Systems  Constitutional: Positive for chills. Negative for fever.  HENT: Negative.  Negative for congestion, ear pain, nosebleeds and sore throat.   Eyes: Negative.  Negative for discharge and redness.  Respiratory: Positive for cough and wheezing. Negative for hemoptysis, sputum production and shortness of breath.   Cardiovascular: Negative.  Negative for chest pain and palpitations.  Gastrointestinal: Negative for abdominal pain, nausea and vomiting.  Genitourinary: Negative.   Musculoskeletal: Positive for myalgias.  Skin: Negative for rash.  Neurological: Negative.  Negative for dizziness and headaches.  Endo/Heme/Allergies: Negative.   All other systems reviewed and are negative.   Vitals:   12/03/18 1456  BP: (!) 146/72  Pulse: (!) 103  Resp: 17  Temp: 98.9 F (37.2 C)  SpO2: 98%    Physical Exam Vitals signs reviewed.  Constitutional:      Appearance: Normal appearance.  HENT:     Head: Normocephalic and atraumatic.     Nose: Nose normal.     Mouth/Throat:     Mouth: Mucous membranes are moist.  Eyes:     Extraocular Movements: Extraocular movements intact.     Conjunctiva/sclera: Conjunctivae normal.     Pupils: Pupils are equal, round, and reactive to light.  Neck:     Musculoskeletal: Normal range of motion and neck supple.  Cardiovascular:     Rate and Rhythm: Normal rate and regular rhythm.     Heart sounds: Normal heart sounds.  Pulmonary:     Effort: Pulmonary effort is normal.     Breath sounds: Rales (Crackles right lower posterior third) present.  Abdominal:     General: There is no distension.     Palpations: Abdomen is soft.     Tenderness: There is no abdominal tenderness.  Musculoskeletal: Normal range of motion.  Skin:    General:  Skin is warm and dry.  Neurological:     Mental Status: She is alert.     Dg Chest 2 View  Result Date: 12/03/2018 CLINICAL DATA:  Cough EXAM: CHEST - 2 VIEW COMPARISON:  None. FINDINGS: There is right middle lobe airspace disease. There is no pleural effusion or pneumothorax. The heart and mediastinal contours are unremarkable. The osseous structures are unremarkable. IMPRESSION: Right middle lobe  pneumonia. Followup PA and lateral chest X-ray is recommended in 3-4 weeks following trial of antibiotic therapy to ensure resolution and exclude underlying malignancy. Electronically Signed   By: Kathreen Devoid   On: 12/03/2018 15:36    ASSESSMENT & PLAN: Malyia was seen today for cough, wheezing and generalized body aches.  Diagnoses and all orders for this visit:  Bacterial pneumonia -     cefTRIAXone (ROCEPHIN) injection 1 g -     azithromycin (ZITHROMAX) 250 MG tablet; Sig as indicated -     amoxicillin-clavulanate (AUGMENTIN) 875-125 MG tablet; Take 1 tablet by mouth 2 (two) times daily for 7 days.  Cough -     DG Chest 2 View; Future    Patient Instructions  Community-Acquired Pneumonia, Adult Pneumonia is an infection of the lungs. It causes swelling in the airways of the lungs. Mucus and fluid may also build up inside the airways. One type of pneumonia can happen while a person is in a hospital. A different type can happen when a person is not in a hospital (community-acquired pneumonia).  What are the causes?  This condition is caused by germs (viruses, bacteria, or fungi). Some types of germs can be passed from one person to another. This can happen when you breathe in droplets from the cough or sneeze of an infected person. What increases the risk? You are more likely to develop this condition if you:  Have a long-term (chronic) disease, such as: ? Chronic obstructive pulmonary disease (COPD). ? Asthma. ? Cystic fibrosis. ? Congestive heart failure. ? Diabetes. ? Kidney  disease.  Have HIV.  Have sickle cell disease.  Have had your spleen removed.  Do not take good care of your teeth and mouth (poor dental hygiene).  Have a medical condition that increases the risk of breathing in droplets from your own mouth and nose.  Have a weakened body defense system (immune system).  Are a smoker.  Travel to areas where the germs that cause this illness are common.  Are around certain animals or the places they live. What are the signs or symptoms?  A dry cough.  A wet (productive) cough.  Fever.  Sweating.  Chest pain. This often happens when breathing deeply or coughing.  Fast breathing or trouble breathing.  Shortness of breath.  Shaking chills.  Feeling tired (fatigue).  Muscle aches. How is this treated? Treatment for this condition depends on many things. Most adults can be treated at home. In some cases, treatment must happen in a hospital. Treatment may include:  Medicines given by mouth or through an IV tube.  Being given extra oxygen.  Respiratory therapy. In rare cases, treatment for very bad pneumonia may include:  Using a machine to help you breathe.  Having a procedure to remove fluid from around your lungs. Follow these instructions at home: Medicines  Take over-the-counter and prescription medicines only as told by your doctor. ? Only take cough medicine if you are losing sleep.  If you were prescribed an antibiotic medicine, take it as told by your doctor. Do not stop taking the antibiotic even if you start to feel better. General instructions   Sleep with your head and neck raised (elevated). You can do this by sleeping in a recliner or by putting a few pillows under your head.  Rest as needed. Get at least 8 hours of sleep each night.  Drink enough water to keep your pee (urine) pale yellow.  Eat a healthy diet that  includes plenty of vegetables, fruits, whole grains, low-fat dairy products, and lean  protein.  Do not use any products that contain nicotine or tobacco. These include cigarettes, e-cigarettes, and chewing tobacco. If you need help quitting, ask your doctor.  Keep all follow-up visits as told by your doctor. This is important. How is this prevented? A shot (vaccine) can help prevent pneumonia. Shots are often suggested for:  People older than 58 years of age.  People older than 58 years of age who: ? Are having cancer treatment. ? Have long-term (chronic) lung disease. ? Have problems with their body's defense system. You may also prevent pneumonia if you take these actions:  Get the flu (influenza) shot every year.  Go to the dentist as often as told.  Wash your hands often. If you cannot use soap and water, use hand sanitizer. Contact a doctor if:  You have a fever.  You lose sleep because your cough medicine does not help. Get help right away if:  You are short of breath and it gets worse.  You have more chest pain.  Your sickness gets worse. This is very serious if: ? You are an older adult. ? Your body's defense system is weak.  You cough up blood. Summary  Pneumonia is an infection of the lungs.  Most adults can be treated at home. Some will need treatment in a hospital.  Drink enough water to keep your pee pale yellow.  Get at least 8 hours of sleep each night. This information is not intended to replace advice given to you by your health care provider. Make sure you discuss any questions you have with your health care provider. Document Released: 05/06/2008 Document Revised: 07/16/2018 Document Reviewed: 07/16/2018 Elsevier Interactive Patient Education  2019 Elsevier Inc.      Agustina Caroli, MD [facility]

## 2018-12-09 ENCOUNTER — Ambulatory Visit: Payer: 59 | Admitting: Emergency Medicine

## 2018-12-09 ENCOUNTER — Other Ambulatory Visit: Payer: Self-pay

## 2018-12-09 ENCOUNTER — Encounter: Payer: Self-pay | Admitting: Emergency Medicine

## 2018-12-09 VITALS — BP 132/79 | HR 86 | Temp 98.5°F | Resp 16 | Wt 143.2 lb

## 2018-12-09 DIAGNOSIS — J159 Unspecified bacterial pneumonia: Secondary | ICD-10-CM

## 2018-12-09 NOTE — Assessment & Plan Note (Signed)
Clinically much improved.  Normal vital signs.  No abnormal findings on physical exam.  Responded very well to antibiotic therapy.  Continue and finish Augmentin.  Follow-up in 1 week for repeat chest x-ray and reevaluation.

## 2018-12-09 NOTE — Progress Notes (Signed)
Vanessa Nixon 59 y.o.   Chief Complaint  Patient presents with  . Pneumonia    FOLLOW UP    HISTORY OF PRESENT ILLNESS: This is a 58 y.o. female here for follow-up of pneumonia.  Seen by me on 12/03/2018 with right sided pneumonia.  Still taking Augmentin.  Finished Z-Pak.  Feels much better although still tired.  No new symptoms.  Tolerating antibiotics well.  HPI   Prior to Admission medications   Medication Sig Start Date End Date Taking? Authorizing Provider  amoxicillin-clavulanate (AUGMENTIN) 875-125 MG tablet Take 1 tablet by mouth 2 (two) times daily for 7 days. 12/03/18 12/10/18 Yes Brandalynn Ofallon, Ines Bloomer, MD  clobetasol ointment (TEMOVATE) 2.69 % Apply 1 application topically 2 (two) times daily. 10/13/18  Yes Timmothy Euler, [place] D, PA-C  IRON PO Take by mouth.   Yes [provider]  Multiple Vitamins-Minerals (MULTIVITAMIN WITH MINERALS) tablet Take 1 tablet by mouth daily.   Yes [provider]  ALPRAZolam (XANAX) 0.5 MG tablet Take 1 tablet (0.5 mg total) by mouth 2 (two) times daily as needed for anxiety. Take 90-60 minutes before MRI. Can repeat dose 30 minutes before MRI if needed Patient not taking: Reported on 12/09/2018 11/17/18   Rutherford Guys, MD  benzonatate (TESSALON) 100 MG capsule Take 1-2 capsules (100-200 mg total) by mouth 3 (three) times daily as needed for cough. Patient not taking: Reported on 12/09/2018 11/30/18   Tenna Delaine D, PA-C  meloxicam (MOBIC) 7.5 MG tablet Take 1 tablet (7.5 mg total) by mouth daily. Patient not taking: Reported on 12/09/2018 10/13/18   Leonie Douglas, PA-C  promethazine-dextromethorphan (PROMETHAZINE-DM) 6.25-15 MG/5ML syrup Take 5 mLs by mouth 4 (four) times daily as needed for cough. Patient not taking: Reported on 12/09/2018 11/30/18   Tenna Delaine D, PA-C    Allergies  Allergen Reactions  . Codeine Itching    Patient Active Problem List   Diagnosis Date Noted  . Cough 12/03/2018  . Bacterial  pneumonia 12/03/2018    Past Medical History:  Diagnosis Date  . Allergy   . Anemia   . Arthritis     Past Surgical History:  Procedure Laterality Date  . CESAREAN SECTION    . COLONOSCOPY     30 plus years ago  . WISDOM TOOTH EXTRACTION      Social History   Socioeconomic History  . Marital status: Married    Spouse name: Not on file  . Number of children: 5  . Years of education: Not on file  . Highest education level: Bachelor's degree (e.g., BA, AB, BS)  Occupational History  . Not on file  Social Needs  . Financial resource strain: Not hard at all  . Food insecurity:    Worry: Never true    Inability: Never true  . Transportation needs:    Medical: No    Non-medical: No  Tobacco Use  . Smoking status: Never Smoker  . Smokeless tobacco: Never Used  Substance and Sexual Activity  . Alcohol use: Never    Alcohol/week: 0.0 standard drinks    Frequency: Never  . Drug use: No  . Sexual activity: Yes    Partners: Male    Birth control/protection: Condom    Comment: with monogamous husband-1st intercourse 19 yo-Fewer than 5 partners  Lifestyle  . Physical activity:    Days per week: 0 days    Minutes per session: 0 min  . Stress: Only a little  Relationships  . Social  connections:    Talks on phone: More than three times a week    Gets together: Once a week    Attends religious service: More than 4 times per year    Active member of club or organization: Yes    Attends meetings of clubs or organizations: More than 4 times per year    Relationship status: Married  . Intimate partner violence:    Fear of current or ex partner: No    Emotionally abused: No    Physically abused: No    Forced sexual activity: No  Other Topics Concern  . Not on file  Social History Narrative   Pt is from [place]. Works at [employer] with [employer]. Lives at home with husband and 4 of 5 children.     Family History  Problem Relation Age of Onset  . COPD Mother   .  Arthritis Mother   . Breast cancer Mother 8  . Alzheimer's disease Father   . Cancer Father        Prostate,Multiple myloma  . Cancer Sister        Liver  . Breast cancer Sister 58  . Colon cancer Paternal Aunt   . Colon polyps Neg Hx   . Esophageal cancer Neg Hx   . Rectal cancer Neg Hx   . Stomach cancer Neg Hx      Review of Systems  Constitutional: Negative.  Negative for chills and fever.  Eyes: Negative.   Respiratory: Positive for cough. Negative for sputum production, shortness of breath and wheezing.   Cardiovascular: Negative for chest pain and palpitations.  Gastrointestinal: Negative for abdominal pain, diarrhea, nausea and vomiting.  Genitourinary: Negative.   Musculoskeletal: Negative for back pain, myalgias and neck pain.  Skin: Negative.  Negative for rash.  Neurological: Negative.  Negative for dizziness and headaches.  Endo/Heme/Allergies: Negative.   All other systems reviewed and are negative.   Vitals:   12/09/18 1133  BP: 132/79  Pulse: 86  Resp: 16  Temp: 98.5 F (36.9 C)  SpO2: 96%    Physical Exam Vitals signs reviewed.  Constitutional:      Appearance: Normal appearance.  HENT:     Head: Normocephalic.     Mouth/Throat:     Mouth: Mucous membranes are moist.     Pharynx: Oropharynx is clear.  Eyes:     Extraocular Movements: Extraocular movements intact.     Conjunctiva/sclera: Conjunctivae normal.     Pupils: Pupils are equal, round, and reactive to light.  Neck:     Musculoskeletal: Normal range of motion and neck supple.  Cardiovascular:     Rate and Rhythm: Normal rate and regular rhythm.  Pulmonary:     Effort: Pulmonary effort is normal. No respiratory distress.     Breath sounds: Normal breath sounds. No wheezing, rhonchi or rales.  Musculoskeletal: Normal range of motion.  Skin:    General: Skin is warm and dry.  Neurological:     General: No focal deficit present.     Mental Status: She is alert and oriented to  person, place, and time.  Psychiatric:        Mood and Affect: Mood normal.        Behavior: Behavior normal.      ASSESSMENT & PLAN: Bacterial pneumonia Clinically much improved.  Normal vital signs.  No abnormal findings on physical exam.  Responded very well to antibiotic therapy.  Continue and finish Augmentin.  Follow-up in 1 week for  repeat chest x-ray and reevaluation.  Nimo was seen today for pneumonia.  Diagnoses and all orders for this visit:  Bacterial pneumonia Comments: Much improved    Patient Instructions       If you have lab work done today you will be contacted with your lab results within the next 2 weeks.  If you have not heard from [place] then please contact us. The fastest way to get your results is to register for My Chart.   IF you received an x-ray today, you will receive an invoice from [facility]. Please contact [facility] at [phone] with questions or concerns regarding your invoice.   IF you received labwork today, you will receive an invoice from [facility]. Please contact [facility] at [phone] with questions or concerns regarding your invoice.   Our billing staff will not be able to assist you with questions regarding bills from these companies.  You will be contacted with the lab results as soon as they are available. The fastest way to get your results is to activate your My Chart account. Instructions are located on the last page of this paperwork. If you have not heard from [place] regarding the results in 2 weeks, please contact this office.     Community-Acquired Pneumonia, Adult Pneumonia is an infection of the lungs. It causes swelling in the airways of the lungs. Mucus and fluid may also build up inside the airways. One type of pneumonia can happen while a person is in a hospital. A different type can happen when a person is not in a hospital (community-acquired pneumonia).  What are the causes?  This condition is  caused by germs (viruses, bacteria, or fungi). Some types of germs can be passed from one person to another. This can happen when you breathe in droplets from the cough or sneeze of an infected person. What increases the risk? You are more likely to develop this condition if you:  Have a long-term (chronic) disease, such as: ? Chronic obstructive pulmonary disease (COPD). ? Asthma. ? Cystic fibrosis. ? Congestive heart failure. ? Diabetes. ? Kidney disease.  Have HIV.  Have sickle cell disease.  Have had your spleen removed.  Do not take good care of your teeth and mouth (poor dental hygiene).  Have a medical condition that increases the risk of breathing in droplets from your own mouth and nose.  Have a weakened body defense system (immune system).  Are a smoker.  Travel to areas where the germs that cause this illness are common.  Are around certain animals or the places they live. What are the signs or symptoms?  A dry cough.  A wet (productive) cough.  Fever.  Sweating.  Chest pain. This often happens when breathing deeply or coughing.  Fast breathing or trouble breathing.  Shortness of breath.  Shaking chills.  Feeling tired (fatigue).  Muscle aches. How is this treated? Treatment for this condition depends on many things. Most adults can be treated at home. In some cases, treatment must happen in a hospital. Treatment may include:  Medicines given by mouth or through an IV tube.  Being given extra oxygen.  Respiratory therapy. In rare cases, treatment for very bad pneumonia may include:  Using a machine to help you breathe.  Having a procedure to remove fluid from around your lungs. Follow these instructions at home: Medicines  Take over-the-counter and prescription medicines only as told by your doctor. ? Only take cough medicine if you are losing sleep.  If you were prescribed an antibiotic medicine, take it as told by your doctor. Do not  stop taking the antibiotic even if you start to feel better. General instructions   Sleep with your head and neck raised (elevated). You can do this by sleeping in a recliner or by putting a few pillows under your head.  Rest as needed. Get at least 8 hours of sleep each night.  Drink enough water to keep your pee (urine) pale yellow.  Eat a healthy diet that includes plenty of vegetables, fruits, whole grains, low-fat dairy products, and lean protein.  Do not use any products that contain nicotine or tobacco. These include cigarettes, e-cigarettes, and chewing tobacco. If you need help quitting, ask your doctor.  Keep all follow-up visits as told by your doctor. This is important. How is this prevented? A shot (vaccine) can help prevent pneumonia. Shots are often suggested for:  People older than 58 years of age.  People older than 58 years of age who: ? Are having cancer treatment. ? Have long-term (chronic) lung disease. ? Have problems with their body's defense system. You may also prevent pneumonia if you take these actions:  Get the flu (influenza) shot every year.  Go to the dentist as often as told.  Wash your hands often. If you cannot use soap and water, use hand sanitizer. Contact a doctor if:  You have a fever.  You lose sleep because your cough medicine does not help. Get help right away if:  You are short of breath and it gets worse.  You have more chest pain.  Your sickness gets worse. This is very serious if: ? You are an older adult. ? Your body's defense system is weak.  You cough up blood. Summary  Pneumonia is an infection of the lungs.  Most adults can be treated at home. Some will need treatment in a hospital.  Drink enough water to keep your pee pale yellow.  Get at least 8 hours of sleep each night. This information is not intended to replace advice given to you by your health care provider. Make sure you discuss any questions you have  with your health care provider. Document Released: 05/06/2008 Document Revised: 07/16/2018 Document Reviewed: 07/16/2018 Elsevier Interactive Patient Education  2019 Elsevier Inc.      Agustina Caroli, MD [facility]

## 2018-12-09 NOTE — Patient Instructions (Addendum)
If you have lab work done today you will be contacted with your lab results within the next 2 weeks.  If you have not heard from [place] then please contact us. The fastest way to get your results is to register for My Chart.   IF you received an x-ray today, you will receive an invoice from [facility]. Please contact [facility] at [phone] with questions or concerns regarding your invoice.   IF you received labwork today, you will receive an invoice from [facility]. Please contact [facility] at [phone] with questions or concerns regarding your invoice.   Our billing staff will not be able to assist you with questions regarding bills from these companies.  You will be contacted with the lab results as soon as they are available. The fastest way to get your results is to activate your My Chart account. Instructions are located on the last page of this paperwork. If you have not heard from [place] regarding the results in 2 weeks, please contact this office.     Community-Acquired Pneumonia, Adult Pneumonia is an infection of the lungs. It causes swelling in the airways of the lungs. Mucus and fluid may also build up inside the airways. One type of pneumonia can happen while a person is in a hospital. A different type can happen when a person is not in a hospital (community-acquired pneumonia).  What are the causes?  This condition is caused by germs (viruses, bacteria, or fungi). Some types of germs can be passed from one person to another. This can happen when you breathe in droplets from the cough or sneeze of an infected person. What increases the risk? You are more likely to develop this condition if you:  Have a long-term (chronic) disease, such as: ? Chronic obstructive pulmonary disease (COPD). ? Asthma. ? Cystic fibrosis. ? Congestive heart failure. ? Diabetes. ? Kidney disease.  Have HIV.  Have sickle cell disease.  Have had your spleen removed.  Do  not take good care of your teeth and mouth (poor dental hygiene).  Have a medical condition that increases the risk of breathing in droplets from your own mouth and nose.  Have a weakened body defense system (immune system).  Are a smoker.  Travel to areas where the germs that cause this illness are common.  Are around certain animals or the places they live. What are the signs or symptoms?  A dry cough.  A wet (productive) cough.  Fever.  Sweating.  Chest pain. This often happens when breathing deeply or coughing.  Fast breathing or trouble breathing.  Shortness of breath.  Shaking chills.  Feeling tired (fatigue).  Muscle aches. How is this treated? Treatment for this condition depends on many things. Most adults can be treated at home. In some cases, treatment must happen in a hospital. Treatment may include:  Medicines given by mouth or through an IV tube.  Being given extra oxygen.  Respiratory therapy. In rare cases, treatment for very bad pneumonia may include:  Using a machine to help you breathe.  Having a procedure to remove fluid from around your lungs. Follow these instructions at home: Medicines  Take over-the-counter and prescription medicines only as told by your doctor. ? Only take cough medicine if you are losing sleep.  If you were prescribed an antibiotic medicine, take it as told by your doctor. Do not stop taking the antibiotic even if you start to feel better. General instructions   Sleep with  your head and neck raised (elevated). You can do this by sleeping in a recliner or by putting a few pillows under your head.  Rest as needed. Get at least 8 hours of sleep each night.  Drink enough water to keep your pee (urine) pale yellow.  Eat a healthy diet that includes plenty of vegetables, fruits, whole grains, low-fat dairy products, and lean protein.  Do not use any products that contain nicotine or tobacco. These include cigarettes,  e-cigarettes, and chewing tobacco. If you need help quitting, ask your doctor.  Keep all follow-up visits as told by your doctor. This is important. How is this prevented? A shot (vaccine) can help prevent pneumonia. Shots are often suggested for:  People older than 58 years of age.  People older than 58 years of age who: ? Are having cancer treatment. ? Have long-term (chronic) lung disease. ? Have problems with their body's defense system. You may also prevent pneumonia if you take these actions:  Get the flu (influenza) shot every year.  Go to the dentist as often as told.  Wash your hands often. If you cannot use soap and water, use hand sanitizer. Contact a doctor if:  You have a fever.  You lose sleep because your cough medicine does not help. Get help right away if:  You are short of breath and it gets worse.  You have more chest pain.  Your sickness gets worse. This is very serious if: ? You are an older adult. ? Your body's defense system is weak.  You cough up blood. Summary  Pneumonia is an infection of the lungs.  Most adults can be treated at home. Some will need treatment in a hospital.  Drink enough water to keep your pee pale yellow.  Get at least 8 hours of sleep each night. This information is not intended to replace advice given to you by your health care provider. Make sure you discuss any questions you have with your health care provider. Document Released: 05/06/2008 Document Revised: 07/16/2018 Document Reviewed: 07/16/2018 Elsevier Interactive Patient Education  2019 [employer].

## 2018-12-17 ENCOUNTER — Ambulatory Visit: Payer: 59 | Admitting: Emergency Medicine

## 2018-12-17 ENCOUNTER — Encounter: Payer: Self-pay | Admitting: Emergency Medicine

## 2018-12-17 VITALS — BP 120/61 | HR 78 | Temp 98.0°F | Resp 16 | Ht 61.0 in | Wt 144.0 lb

## 2018-12-17 DIAGNOSIS — J159 Unspecified bacterial pneumonia: Secondary | ICD-10-CM

## 2018-12-17 NOTE — Progress Notes (Signed)
Vanessa Nixon 58 y.o.   Chief Complaint  Patient presents with  . Follow-up    pnuemonia/ pt feeling better    HISTORY OF PRESENT ILLNESS: This is a 58 y.o. female here for follow-up of pneumonia.  Feeling much better.  No new symptoms.  HPI   Prior to Admission medications   Medication Sig Start Date End Date Taking? Authorizing Provider  ALPRAZolam Duanne Moron) 0.5 MG tablet Take 1 tablet (0.5 mg total) by mouth 2 (two) times daily as needed for anxiety. Take 90-60 minutes before MRI. Can repeat dose 30 minutes before MRI if needed 11/17/18  Yes Rutherford Guys, MD  clobetasol ointment (TEMOVATE) 1.60 % Apply 1 application topically 2 (two) times daily. 10/13/18  Yes Timmothy Euler, [place] D, PA-C  IRON PO Take by mouth.   Yes [provider]  Multiple Vitamins-Minerals (MULTIVITAMIN WITH MINERALS) tablet Take 1 tablet by mouth daily.   Yes [provider]    Allergies  Allergen Reactions  . Codeine Itching    Patient Active Problem List   Diagnosis Date Noted  . Cough 12/03/2018  . Bacterial pneumonia 12/03/2018    Past Medical History:  Diagnosis Date  . Allergy   . Anemia   . Arthritis     Past Surgical History:  Procedure Laterality Date  . CESAREAN SECTION    . COLONOSCOPY     30 plus years ago  . WISDOM TOOTH EXTRACTION      Social History   Socioeconomic History  . Marital status: Married    Spouse name: Not on file  . Number of children: 5  . Years of education: Not on file  . Highest education level: Bachelor's degree (e.g., BA, AB, BS)  Occupational History  . Not on file  Social Needs  . Financial resource strain: Not hard at all  . Food insecurity:    Worry: Never true    Inability: Never true  . Transportation needs:    Medical: No    Non-medical: No  Tobacco Use  . Smoking status: Never Smoker  . Smokeless tobacco: Never Used  Substance and Sexual Activity  . Alcohol use: Never    Alcohol/week: 0.0 standard drinks     Frequency: Never  . Drug use: No  . Sexual activity: Yes    Partners: Male    Birth control/protection: Condom    Comment: with monogamous husband-1st intercourse 19 yo-Fewer than 5 partners  Lifestyle  . Physical activity:    Days per week: 0 days    Minutes per session: 0 min  . Stress: Only a little  Relationships  . Social connections:    Talks on phone: More than three times a week    Gets together: Once a week    Attends religious service: More than 4 times per year    Active member of club or organization: Yes    Attends meetings of clubs or organizations: More than 4 times per year    Relationship status: Married  . Intimate partner violence:    Fear of current or ex partner: No    Emotionally abused: No    Physically abused: No    Forced sexual activity: No  Other Topics Concern  . Not on file  Social History Narrative   Pt is from [place]. Works at [employer] with [employer]. Lives at home with husband and 4 of 5 children.     Family History  Problem Relation Age of Onset  . COPD  Mother   . Arthritis Mother   . Breast cancer Mother 38  . Alzheimer's disease Father   . Cancer Father        Prostate,Multiple myloma  . Cancer Sister        Liver  . Breast cancer Sister 34  . Colon cancer Paternal Aunt   . Colon polyps Neg Hx   . Esophageal cancer Neg Hx   . Rectal cancer Neg Hx   . Stomach cancer Neg Hx      Review of Systems  Constitutional: Negative.  Negative for fever.  Respiratory: Negative for cough and shortness of breath.   Cardiovascular: Negative for chest pain and palpitations.  Gastrointestinal: Negative for abdominal pain, diarrhea, nausea and vomiting.  Skin: Negative.  Negative for rash.  Neurological: Negative.  Negative for dizziness and headaches.  Endo/Heme/Allergies: Negative.     Vitals:   12/17/18 1631  BP: 120/61  Pulse: 78  Resp: 16  Temp: 98 F (36.7 C)  SpO2: 96%    Physical Exam Vitals signs reviewed.   Constitutional:      Appearance: Normal appearance.  Eyes:     Extraocular Movements: Extraocular movements intact.     Pupils: Pupils are equal, round, and reactive to light.  Neck:     Musculoskeletal: Normal range of motion.  Cardiovascular:     Rate and Rhythm: Normal rate and regular rhythm.     Heart sounds: Normal heart sounds.  Pulmonary:     Effort: Pulmonary effort is normal.     Breath sounds: Normal breath sounds.  Skin:    General: Skin is warm and dry.     Capillary Refill: Capillary refill takes less than 2 seconds.  Neurological:     General: No focal deficit present.     Mental Status: She is alert and oriented to person, place, and time.  Psychiatric:        Mood and Affect: Mood normal.        Behavior: Behavior normal.      ASSESSMENT & PLAN:  Vanessa Nixon was seen today for follow-up.  Diagnoses and all orders for this visit:  Bacterial pneumonia Comments: Resolved Orders: -     DG Chest 2 View; Future   Patient Instructions       If you have lab work done today you will be contacted with your lab results within the next 2 weeks.  If you have not heard from [place] then please contact us. The fastest way to get your results is to register for My Chart.   IF you received an x-ray today, you will receive an invoice from [facility]. Please contact [facility] at [phone] with questions or concerns regarding your invoice.   IF you received labwork today, you will receive an invoice from [facility]. Please contact [facility] at [phone] with questions or concerns regarding your invoice.   Our billing staff will not be able to assist you with questions regarding bills from these companies.  You will be contacted with the lab results as soon as they are available. The fastest way to get your results is to activate your My Chart account. Instructions are located on the last page of this paperwork. If you have not heard from [place] regarding  the results in 2 weeks, please contact this office.     Health Maintenance, Female Adopting a healthy lifestyle and getting preventive care can go a long way to promote health and wellness. Talk with your health care  provider about what schedule of regular examinations is right for you. This is a good chance for you to check in with your provider about disease prevention and staying healthy. In between checkups, there are plenty of things you can do on your own. Experts have done a lot of research about which lifestyle changes and preventive measures are most likely to keep you healthy. Ask your health care provider for more information. Weight and diet Eat a healthy diet  Be sure to include plenty of vegetables, fruits, low-fat dairy products, and lean protein.  Do not eat a lot of foods high in solid fats, added sugars, or salt.  Get regular exercise. This is one of the most important things you can do for your health. ? Most adults should exercise for at least 150 minutes each week. The exercise should increase your heart rate and make you sweat (moderate-intensity exercise). ? Most adults should also do strengthening exercises at least twice a week. This is in addition to the moderate-intensity exercise. Maintain a healthy weight  Body mass index (BMI) is a measurement that can be used to identify possible weight problems. It estimates body fat based on height and weight. Your health care provider can help determine your BMI and help you achieve or maintain a healthy weight.  For females 5 years of age and older: ? A BMI below 18.5 is considered underweight. ? A BMI of 18.5 to 24.9 is normal. ? A BMI of 25 to 29.9 is considered overweight. ? A BMI of 30 and above is considered obese. Watch levels of cholesterol and blood lipids  You should start having your blood tested for lipids and cholesterol at 58 years of age, then have this test every 5 years.  You may need to have your  cholesterol levels checked more often if: ? Your lipid or cholesterol levels are high. ? You are older than 58 years of age. ? You are at high risk for heart disease. Cancer screening Lung Cancer  Lung cancer screening is recommended for adults 56-31 years old who are at high risk for lung cancer because of a history of smoking.  A yearly low-dose CT scan of the lungs is recommended for people who: ? Currently smoke. ? Have quit within the past 15 years. ? Have at least a 30-pack-year history of smoking. A pack year is smoking an average of one pack of cigarettes a day for 1 year.  Yearly screening should continue until it has been 15 years since you quit.  Yearly screening should stop if you develop a health problem that would prevent you from having lung cancer treatment. Breast Cancer  Practice breast self-awareness. This means understanding how your breasts normally appear and feel.  It also means doing regular breast self-exams. Let your health care provider know about any changes, no matter how small.  If you are in your 20s or 30s, you should have a clinical breast exam (CBE) by a health care provider every 1-3 years as part of a regular health exam.  If you are 61 or older, have a CBE every year. Also consider having a breast X-ray (mammogram) every year.  If you have a family history of breast cancer, talk to your health care provider about genetic screening.  If you are at high risk for breast cancer, talk to your health care provider about having an MRI and a mammogram every year.  Breast cancer gene (BRCA) assessment is recommended for women who have  family members with BRCA-related cancers. BRCA-related cancers include: ? Breast. ? Ovarian. ? Tubal. ? Peritoneal cancers.  Results of the assessment will determine the need for genetic counseling and BRCA1 and BRCA2 testing. Cervical Cancer Your health care provider may recommend that you be screened regularly for  cancer of the pelvic organs (ovaries, uterus, and vagina). This screening involves a pelvic examination, including checking for microscopic changes to the surface of your cervix (Pap test). You may be encouraged to have this screening done every 3 years, beginning at age 55.  For women ages 76-65, health care providers may recommend pelvic exams and Pap testing every 3 years, or they may recommend the Pap and pelvic exam, combined with testing for human papilloma virus (HPV), every 5 years. Some types of HPV increase your risk of cervical cancer. Testing for HPV may also be done on women of any age with unclear Pap test results.  Other health care providers may not recommend any screening for nonpregnant women who are considered low risk for pelvic cancer and who do not have symptoms. Ask your health care provider if a screening pelvic exam is right for you.  If you have had past treatment for cervical cancer or a condition that could lead to cancer, you need Pap tests and screening for cancer for at least 20 years after your treatment. If Pap tests have been discontinued, your risk factors (such as having a new sexual partner) need to be reassessed to determine if screening should resume. Some women have medical problems that increase the chance of getting cervical cancer. In these cases, your health care provider may recommend more frequent screening and Pap tests. Colorectal Cancer  This type of cancer can be detected and often prevented.  Routine colorectal cancer screening usually begins at 58 years of age and continues through 58 years of age.  Your health care provider may recommend screening at an earlier age if you have risk factors for colon cancer.  Your health care provider may also recommend using home test kits to check for hidden blood in the stool.  A small camera at the end of a tube can be used to examine your colon directly (sigmoidoscopy or colonoscopy). This is done to check for  the earliest forms of colorectal cancer.  Routine screening usually begins at age 57.  Direct examination of the colon should be repeated every 5-10 years through 58 years of age. However, you may need to be screened more often if early forms of precancerous polyps or small growths are found. Skin Cancer  Check your skin from head to toe regularly.  Tell your health care provider about any new moles or changes in moles, especially if there is a change in a mole's shape or color.  Also tell your health care provider if you have a mole that is larger than the size of a pencil eraser.  Always use sunscreen. Apply sunscreen liberally and repeatedly throughout the day.  Protect yourself by wearing long sleeves, pants, a wide-brimmed hat, and sunglasses whenever you are outside. Heart disease, diabetes, and high blood pressure  High blood pressure causes heart disease and increases the risk of stroke. High blood pressure is more likely to develop in: ? People who have blood pressure in the high end of the normal range (130-139/85-89 mm Hg). ? People who are overweight or obese. ? People who are African American.  If you are 59-48 years of age, have your blood pressure checked every 3-5  years. If you are 27 years of age or older, have your blood pressure checked every year. You should have your blood pressure measured twice-once when you are at a hospital or clinic, and once when you are not at a hospital or clinic. Record the average of the two measurements. To check your blood pressure when you are not at a hospital or clinic, you can use: ? An automated blood pressure machine at a pharmacy. ? A home blood pressure monitor.  If you are between 60 years and 81 years old, ask your health care provider if you should take aspirin to prevent strokes.  Have regular diabetes screenings. This involves taking a blood sample to check your fasting blood sugar level. ? If you are at a normal weight and  have a low risk for diabetes, have this test once every three years after 58 years of age. ? If you are overweight and have a high risk for diabetes, consider being tested at a younger age or more often. Preventing infection Hepatitis B  If you have a higher risk for hepatitis B, you should be screened for this virus. You are considered at high risk for hepatitis B if: ? You were born in a country where hepatitis B is common. Ask your health care provider which countries are considered high risk. ? Your parents were born in a high-risk country, and you have not been immunized against hepatitis B (hepatitis B vaccine). ? You have HIV or AIDS. ? You use needles to inject street drugs. ? You live with someone who has hepatitis B. ? You have had sex with someone who has hepatitis B. ? You get hemodialysis treatment. ? You take certain medicines for conditions, including cancer, organ transplantation, and autoimmune conditions. Hepatitis C  Blood testing is recommended for: ? Everyone born from [birth year] through 1965. ? Anyone with known risk factors for hepatitis C. Sexually transmitted infections (STIs)  You should be screened for sexually transmitted infections (STIs) including gonorrhea and chlamydia if: ? You are sexually active and are younger than 58 years of age. ? You are older than 58 years of age and your health care provider tells you that you are at risk for this type of infection. ? Your sexual activity has changed since you were last screened and you are at an increased risk for chlamydia or gonorrhea. Ask your health care provider if you are at risk.  If you do not have HIV, but are at risk, it may be recommended that you take a prescription medicine daily to prevent HIV infection. This is called pre-exposure prophylaxis (PrEP). You are considered at risk if: ? You are sexually active and do not regularly use condoms or know the HIV status of your partner(s). ? You take drugs by  injection. ? You are sexually active with a partner who has HIV. Talk with your health care provider about whether you are at high risk of being infected with HIV. If you choose to begin PrEP, you should first be tested for HIV. You should then be tested every 3 months for as long as you are taking PrEP. Pregnancy  If you are premenopausal and you may become pregnant, ask your health care provider about preconception counseling.  If you may become pregnant, take 400 to 800 micrograms (mcg) of folic acid every day.  If you want to prevent pregnancy, talk to your health care provider about birth control (contraception). Osteoporosis and menopause  Osteoporosis is a disease  in which the bones lose minerals and strength with aging. This can result in serious bone fractures. Your risk for osteoporosis can be identified using a bone density scan.  If you are 21 years of age or older, or if you are at risk for osteoporosis and fractures, ask your health care provider if you should be screened.  Ask your health care provider whether you should take a calcium or vitamin D supplement to lower your risk for osteoporosis.  Menopause may have certain physical symptoms and risks.  Hormone replacement therapy may reduce some of these symptoms and risks. Talk to your health care provider about whether hormone replacement therapy is right for you. Follow these instructions at home:  Schedule regular health, dental, and eye exams.  Stay current with your immunizations.  Do not use any tobacco products including cigarettes, chewing tobacco, or electronic cigarettes.  If you are pregnant, do not drink alcohol.  If you are breastfeeding, limit how much and how often you drink alcohol.  Limit alcohol intake to no more than 1 drink per day for nonpregnant women. One drink equals 12 ounces of beer, 5 ounces of wine, or 1 ounces of hard liquor.  Do not use street drugs.  Do not share needles.  Ask  your health care provider for help if you need support or information about quitting drugs.  Tell your health care provider if you often feel depressed.  Tell your health care provider if you have ever been abused or do not feel safe at home. This information is not intended to replace advice given to you by your health care provider. Make sure you discuss any questions you have with your health care provider. Document Released: 06/03/2011 Document Revised: 04/25/2016 Document Reviewed: 08/22/2015 Elsevier Interactive Patient Education  2019 [employer]     Agustina Caroli, MD [facility]

## 2018-12-17 NOTE — Patient Instructions (Addendum)
   If you have lab work done today you will be contacted with your lab results within the next 2 weeks.  If you have not heard from us then please contact us. The fastest way to get your results is to register for My Chart.   IF you received an x-ray today, you will receive an invoice from [facility]. Please contact [facility] at [phone] with questions or concerns regarding your invoice.   IF you received labwork today, you will receive an invoice from [facility]. Please contact [facility] at [phone] with questions or concerns regarding your invoice.   Our billing staff will not be able to assist you with questions regarding bills from these companies.  You will be contacted with the lab results as soon as they are available. The fastest way to get your results is to activate your My Chart account. Instructions are located on the last page of this paperwork. If you have not heard from us regarding the results in 2 weeks, please contact this office.    Health Maintenance, Female Adopting a healthy lifestyle and getting preventive care can go a long way to promote health and wellness. Talk with your health care provider about what schedule of regular examinations is right for you. This is a good chance for you to check in with your provider about disease prevention and staying healthy. In between checkups, there are plenty of things you can do on your own. Experts have done a lot of research about which lifestyle changes and preventive measures are most likely to keep you healthy. Ask your health care provider for more information. Weight and diet Eat a healthy diet  Be sure to include plenty of vegetables, fruits, low-fat dairy products, and lean protein.  Do not eat a lot of foods high in solid fats, added sugars, or salt.  Get regular exercise. This is one of the most important things you can do for your health. ? Most adults should exercise for at least 150  minutes each week. The exercise should increase your heart rate and make you sweat (moderate-intensity exercise). ? Most adults should also do strengthening exercises at least twice a week. This is in addition to the moderate-intensity exercise. Maintain a healthy weight  Body mass index (BMI) is a measurement that can be used to identify possible weight problems. It estimates body fat based on height and weight. Your health care provider can help determine your BMI and help you achieve or maintain a healthy weight.  For females 20 years of age and older: ? A BMI below 18.5 is considered underweight. ? A BMI of 18.5 to 24.9 is normal. ? A BMI of 25 to 29.9 is considered overweight. ? A BMI of 30 and above is considered obese. Watch levels of cholesterol and blood lipids  You should start having your blood tested for lipids and cholesterol at 58 years of age, then have this test every 5 years.  You may need to have your cholesterol levels checked more often if: ? Your lipid or cholesterol levels are high. ? You are older than 58 years of age. ? You are at high risk for heart disease. Cancer screening Lung Cancer  Lung cancer screening is recommended for adults 55-80 years old who are at high risk for lung cancer because of a history of smoking.  A yearly low-dose CT scan of the lungs is recommended for people who: ? Currently smoke. ? Have quit within the past 15   years. ? Have at least a 30-pack-year history of smoking. A pack year is smoking an average of one pack of cigarettes a day for 1 year.  Yearly screening should continue until it has been 15 years since you quit.  Yearly screening should stop if you develop a health problem that would prevent you from having lung cancer treatment. Breast Cancer  Practice breast self-awareness. This means understanding how your breasts normally appear and feel.  It also means doing regular breast self-exams. Let your health care provider  know about any changes, no matter how small.  If you are in your 20s or 30s, you should have a clinical breast exam (CBE) by a health care provider every 1-3 years as part of a regular health exam.  If you are 40 or older, have a CBE every year. Also consider having a breast X-ray (mammogram) every year.  If you have a family history of breast cancer, talk to your health care provider about genetic screening.  If you are at high risk for breast cancer, talk to your health care provider about having an MRI and a mammogram every year.  Breast cancer gene (BRCA) assessment is recommended for women who have family members with BRCA-related cancers. BRCA-related cancers include: ? Breast. ? Ovarian. ? Tubal. ? Peritoneal cancers.  Results of the assessment will determine the need for genetic counseling and BRCA1 and BRCA2 testing. Cervical Cancer Your health care provider may recommend that you be screened regularly for cancer of the pelvic organs (ovaries, uterus, and vagina). This screening involves a pelvic examination, including checking for microscopic changes to the surface of your cervix (Pap test). You may be encouraged to have this screening done every 3 years, beginning at age 21.  For women ages 30-65, health care providers may recommend pelvic exams and Pap testing every 3 years, or they may recommend the Pap and pelvic exam, combined with testing for human papilloma virus (HPV), every 5 years. Some types of HPV increase your risk of cervical cancer. Testing for HPV may also be done on women of any age with unclear Pap test results.  Other health care providers may not recommend any screening for nonpregnant women who are considered low risk for pelvic cancer and who do not have symptoms. Ask your health care provider if a screening pelvic exam is right for you.  If you have had past treatment for cervical cancer or a condition that could lead to cancer, you need Pap tests and  screening for cancer for at least 20 years after your treatment. If Pap tests have been discontinued, your risk factors (such as having a new sexual partner) need to be reassessed to determine if screening should resume. Some women have medical problems that increase the chance of getting cervical cancer. In these cases, your health care provider may recommend more frequent screening and Pap tests. Colorectal Cancer  This type of cancer can be detected and often prevented.  Routine colorectal cancer screening usually begins at 58 years of age and continues through 58 years of age.  Your health care provider may recommend screening at an earlier age if you have risk factors for colon cancer.  Your health care provider may also recommend using home test kits to check for hidden blood in the stool.  A small camera at the end of a tube can be used to examine your colon directly (sigmoidoscopy or colonoscopy). This is done to check for the earliest forms of colorectal cancer.    Routine screening usually begins at age 50.  Direct examination of the colon should be repeated every 5-10 years through 58 years of age. However, you may need to be screened more often if early forms of precancerous polyps or small growths are found. Skin Cancer  Check your skin from head to toe regularly.  Tell your health care provider about any new moles or changes in moles, especially if there is a change in a mole's shape or color.  Also tell your health care provider if you have a mole that is larger than the size of a pencil eraser.  Always use sunscreen. Apply sunscreen liberally and repeatedly throughout the day.  Protect yourself by wearing long sleeves, pants, a wide-brimmed hat, and sunglasses whenever you are outside. Heart disease, diabetes, and high blood pressure  High blood pressure causes heart disease and increases the risk of stroke. High blood pressure is more likely to develop in: ? People who  have blood pressure in the high end of the normal range (130-139/85-89 mm Hg). ? People who are overweight or obese. ? People who are African American.  If you are 18-39 years of age, have your blood pressure checked every 3-5 years. If you are 40 years of age or older, have your blood pressure checked every year. You should have your blood pressure measured twice-once when you are at a hospital or clinic, and once when you are not at a hospital or clinic. Record the average of the two measurements. To check your blood pressure when you are not at a hospital or clinic, you can use: ? An automated blood pressure machine at a pharmacy. ? A home blood pressure monitor.  If you are between 55 years and 79 years old, ask your health care provider if you should take aspirin to prevent strokes.  Have regular diabetes screenings. This involves taking a blood sample to check your fasting blood sugar level. ? If you are at a normal weight and have a low risk for diabetes, have this test once every three years after 58 years of age. ? If you are overweight and have a high risk for diabetes, consider being tested at a younger age or more often. Preventing infection Hepatitis B  If you have a higher risk for hepatitis B, you should be screened for this virus. You are considered at high risk for hepatitis B if: ? You were born in a country where hepatitis B is common. Ask your health care provider which countries are considered high risk. ? Your parents were born in a high-risk country, and you have not been immunized against hepatitis B (hepatitis B vaccine). ? You have HIV or AIDS. ? You use needles to inject street drugs. ? You live with someone who has hepatitis B. ? You have had sex with someone who has hepatitis B. ? You get hemodialysis treatment. ? You take certain medicines for conditions, including cancer, organ transplantation, and autoimmune conditions. Hepatitis C  Blood testing is  recommended for: ? Everyone born from 1945 through 1965. ? Anyone with known risk factors for hepatitis C. Sexually transmitted infections (STIs)  You should be screened for sexually transmitted infections (STIs) including gonorrhea and chlamydia if: ? You are sexually active and are younger than 58 years of age. ? You are older than 58 years of age and your health care provider tells you that you are at risk for this type of infection. ? Your sexual activity has changed since you   were last screened and you are at an increased risk for chlamydia or gonorrhea. Ask your health care provider if you are at risk.  If you do not have HIV, but are at risk, it may be recommended that you take a prescription medicine daily to prevent HIV infection. This is called pre-exposure prophylaxis (PrEP). You are considered at risk if: ? You are sexually active and do not regularly use condoms or know the HIV status of your partner(s). ? You take drugs by injection. ? You are sexually active with a partner who has HIV. Talk with your health care provider about whether you are at high risk of being infected with HIV. If you choose to begin PrEP, you should first be tested for HIV. You should then be tested every 3 months for as long as you are taking PrEP. Pregnancy  If you are premenopausal and you may become pregnant, ask your health care provider about preconception counseling.  If you may become pregnant, take 400 to 800 micrograms (mcg) of folic acid every day.  If you want to prevent pregnancy, talk to your health care provider about birth control (contraception). Osteoporosis and menopause  Osteoporosis is a disease in which the bones lose minerals and strength with aging. This can result in serious bone fractures. Your risk for osteoporosis can be identified using a bone density scan.  If you are 65 years of age or older, or if you are at risk for osteoporosis and fractures, ask your health care  provider if you should be screened.  Ask your health care provider whether you should take a calcium or vitamin D supplement to lower your risk for osteoporosis.  Menopause may have certain physical symptoms and risks.  Hormone replacement therapy may reduce some of these symptoms and risks. Talk to your health care provider about whether hormone replacement therapy is right for you. Follow these instructions at home:  Schedule regular health, dental, and eye exams.  Stay current with your immunizations.  Do not use any tobacco products including cigarettes, chewing tobacco, or electronic cigarettes.  If you are pregnant, do not drink alcohol.  If you are breastfeeding, limit how much and how often you drink alcohol.  Limit alcohol intake to no more than 1 drink per day for nonpregnant women. One drink equals 12 ounces of beer, 5 ounces of wine, or 1 ounces of hard liquor.  Do not use street drugs.  Do not share needles.  Ask your health care provider for help if you need support or information about quitting drugs.  Tell your health care provider if you often feel depressed.  Tell your health care provider if you have ever been abused or do not feel safe at home. This information is not intended to replace advice given to you by your health care provider. Make sure you discuss any questions you have with your health care provider. Document Released: 06/03/2011 Document Revised: 04/25/2016 Document Reviewed: 08/22/2015 Elsevier Interactive Patient Education  2019 Elsevier Inc.  

## 2018-12-19 ENCOUNTER — Ambulatory Visit (INDEPENDENT_AMBULATORY_CARE_PROVIDER_SITE_OTHER): Payer: 59

## 2018-12-19 ENCOUNTER — Ambulatory Visit: Payer: 59

## 2018-12-19 DIAGNOSIS — J189 Pneumonia, unspecified organism: Secondary | ICD-10-CM | POA: Diagnosis not present

## 2018-12-19 DIAGNOSIS — J159 Unspecified bacterial pneumonia: Secondary | ICD-10-CM

## 2019-02-18 ENCOUNTER — Encounter: Payer: Self-pay | Admitting: Emergency Medicine

## 2019-02-18 ENCOUNTER — Ambulatory Visit: Payer: 59 | Admitting: Emergency Medicine

## 2019-02-18 ENCOUNTER — Other Ambulatory Visit: Payer: Self-pay

## 2019-02-18 VITALS — BP 132/70 | HR 92 | Temp 98.0°F | Resp 16 | Ht 61.0 in | Wt 145.0 lb

## 2019-02-18 DIAGNOSIS — S46912A Strain of unspecified muscle, fascia and tendon at shoulder and upper arm level, left arm, initial encounter: Secondary | ICD-10-CM

## 2019-02-18 DIAGNOSIS — M25512 Pain in left shoulder: Secondary | ICD-10-CM | POA: Diagnosis not present

## 2019-02-18 DIAGNOSIS — F418 Other specified anxiety disorders: Secondary | ICD-10-CM

## 2019-02-18 MED ORDER — ALPRAZOLAM 0.5 MG PO TABS: ORAL_TABLET | ORAL | 0 refills | Status: DC

## 2019-02-18 NOTE — Progress Notes (Signed)
Vanessa Nixon 58 y.o.   Chief Complaint  Patient presents with  . Shoulder Pain    left shoulder pain since Sept. not getting any better.     HISTORY OF PRESENT ILLNESS: This is a 58 y.o. female complaining of left shoulder pain since last September.  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  Has restricted range of motion.  No other significant symptoms.  HPI   Prior to Admission medications   Medication Sig Start Date End Date Taking? Authorizing Provider  ALPRAZolam Duanne Moron) 0.5 MG tablet Take 1 tablet (0.5 mg total) by mouth 2 (two) times daily as needed for anxiety. Take 90-60 minutes before MRI. Can repeat dose 30 minutes before MRI if needed 11/17/18  Yes Rutherford Guys, MD  clobetasol ointment (TEMOVATE) 3.41 % Apply 1 application topically 2 (two) times daily. 10/13/18  Yes Timmothy Euler, [place] D, PA-C  IRON PO Take by mouth.   Yes [provider]  Multiple Vitamins-Minerals (MULTIVITAMIN WITH MINERALS) tablet Take 1 tablet by mouth daily.   Yes [provider]    Allergies  Allergen Reactions  . Codeine Itching    Patient Active Problem List   Diagnosis Date Noted  . Cough 12/03/2018  . Bacterial pneumonia 12/03/2018    Past Medical History:  Diagnosis Date  . Allergy   . Anemia   . Arthritis     Past Surgical History:  Procedure Laterality Date  . CESAREAN SECTION    . COLONOSCOPY     30 plus years ago  . WISDOM TOOTH EXTRACTION      Social History   Socioeconomic History  . Marital status: Married    Spouse name: Not on file  . Number of children: 5  . Years of education: Not on file  . Highest education level: Bachelor's degree (e.g., BA, AB, BS)  Occupational History  . Not on file  Social Needs  . Financial resource strain: Not hard at all  . Food insecurity:    Worry: Never true    Inability: Never true  . Transportation needs:    Medical: No    Non-medical: No  Tobacco Use  . Smoking status: Never Smoker  . Smokeless  tobacco: Never Used  Substance and Sexual Activity  . Alcohol use: Never    Alcohol/week: 0.0 standard drinks    Frequency: Never  . Drug use: No  . Sexual activity: Yes    Partners: Male    Birth control/protection: Condom    Comment: with monogamous husband-1st intercourse 19 yo-Fewer than 5 partners  Lifestyle  . Physical activity:    Days per week: 0 days    Minutes per session: 0 min  . Stress: Only a little  Relationships  . Social connections:    Talks on phone: More than three times a week    Gets together: Once a week    Attends religious service: More than 4 times per year    Active member of club or organization: Yes    Attends meetings of clubs or organizations: More than 4 times per year    Relationship status: Married  . Intimate partner violence:    Fear of current or ex partner: No    Emotionally abused: No    Physically abused: No    Forced sexual activity: No  Other Topics Concern  . Not on file  Social History Narrative   Pt is from [place]. Works at [employer] with [employer]. Lives at home with husband and 4  of 5 children.     Family History  Problem Relation Age of Onset  . COPD Mother   . Arthritis Mother   . Breast cancer Mother 66  . Alzheimer's disease Father   . Cancer Father        Prostate,Multiple myloma  . Cancer Sister        Liver  . Breast cancer Sister 75  . Colon cancer Paternal Aunt   . Colon polyps Neg Hx   . Esophageal cancer Neg Hx   . Rectal cancer Neg Hx   . Stomach cancer Neg Hx      Review of Systems  Constitutional: Negative for chills and fever.  Respiratory: Negative for cough and shortness of breath.   Cardiovascular: Negative for chest pain.  Gastrointestinal: Negative for nausea and vomiting.  Musculoskeletal: Positive for joint pain (Left shoulder).  Skin: Negative for rash.  Neurological: Negative for dizziness and headaches.  All other systems reviewed and are negative.   Vitals:   02/18/19 1543   BP: 132/70  Pulse: 92  Resp: 16  Temp: 98 F (36.7 C)  SpO2: 96%    Physical Exam Vitals signs reviewed.  Constitutional:      Appearance: Normal appearance.  HENT:     Head: Normocephalic and atraumatic.  Eyes:     Extraocular Movements: Extraocular movements intact.     Pupils: Pupils are equal, round, and reactive to light.  Neck:     Musculoskeletal: Normal range of motion and neck supple.  Cardiovascular:     Rate and Rhythm: Normal rate.  Pulmonary:     Effort: Pulmonary effort is normal.  Musculoskeletal:     Comments: Left shoulder: No erythema or bruising.  Palpable hard nontender mass deep to lateral.  Limited range of motion to 50%. Left elbow: Within normal limits Left wrist: Within normal limits Left hand: Normal  Skin:    General: Skin is warm and dry.     Capillary Refill: Capillary refill takes less than 2 seconds.  Neurological:     General: No focal deficit present.     Mental Status: She is alert and oriented to person, place, and time.  Psychiatric:        Mood and Affect: Mood normal.        Behavior: Behavior normal.      ASSESSMENT & PLAN: Vanessa Nixon was seen today for shoulder pain.  Diagnoses and all orders for this visit:  Acute pain of left shoulder -     MR Shoulder Left Wo Contrast; Future -     Ambulatory referral to Orthopedic Surgery  Strain of left shoulder, initial encounter -     Ambulatory referral to Orthopedic Surgery  Situational anxiety -     ALPRAZolam (XANAX) 0.5 MG tablet; Take 1 mg p.o. 1 hour before procedure.    Patient Instructions       If you have lab work done today you will be contacted with your lab results within the next 2 weeks.  If you have not heard from [place] then please contact us. The fastest way to get your results is to register for My Chart.   IF you received an x-ray today, you will receive an invoice from [facility]. Please contact [facility] at [phone] with questions  or concerns regarding your invoice.   IF you received labwork today, you will receive an invoice from [facility]. Please contact [facility] at [phone] with questions or concerns regarding your invoice.  Our billing staff will not be able to assist you with questions regarding bills from these companies.  You will be contacted with the lab results as soon as they are available. The fastest way to get your results is to activate your My Chart account. Instructions are located on the last page of this paperwork. If you have not heard from [place] regarding the results in 2 weeks, please contact this office.       Shoulder Pain Many things can cause shoulder pain, including:  An injury.  Moving the shoulder in the same way again and again (overuse).  Joint pain (arthritis). Pain can come from:  Swelling and irritation (inflammation) of any part of the shoulder.  An injury to the shoulder joint.  An injury to: ? Tissues that connect muscle to bone (tendons). ? Tissues that connect bones to each other (ligaments). ? Bones. Follow these instructions at home: Watch for changes in your symptoms. Let your doctor know about them. Follow these instructions to help with your pain. If you have a sling:  Wear the sling as told by your doctor. Remove it only as told by your doctor.  Loosen the sling if your fingers: ? Tingle. ? Become numb. ? Turn cold and blue.  Keep the sling clean.  If the sling is not waterproof: ? Do not let it get wet. ? Take the sling off when you shower or bathe. Managing pain, stiffness, and swelling   If told, put ice on the painful area: ? Put ice in a plastic bag. ? Place a towel between your skin and the bag. ? Leave the ice on for 20 minutes, 2-3 times a day. Stop putting ice on if it does not help with the pain.  Squeeze a soft ball or a foam pad as much as possible. This prevents swelling in the shoulder. It also helps to strengthen the arm.  General instructions  Take over-the-counter and prescription medicines only as told by your doctor.  Keep all follow-up visits as told by your doctor. This is important. Contact a doctor if:  Your pain gets worse.  Medicine does not help your pain.  You have new pain in your arm, hand, or fingers. Get help right away if:  Your arm, hand, or fingers: ? Tingle. ? Are numb. ? Are swollen. ? Are painful. ? Turn white or blue. Summary  Shoulder pain can be caused by many things. These include injury, moving the shoulder in the same away again and again, and joint pain.  Watch for changes in your symptoms. Let your doctor know about them.  This condition may be treated with a sling, ice, and pain medicine.  Contact your doctor if the pain gets worse or you have new pain. Get help right away if your arm, hand, or fingers tingle or get numb, swollen, or painful.  Keep all follow-up visits as told by your doctor. This is important. This information is not intended to replace advice given to you by your health care provider. Make sure you discuss any questions you have with your health care provider. Document Released: 05/06/2008 Document Revised: 06/02/2018 Document Reviewed: 06/02/2018 Elsevier Interactive Patient Education  2019 Elsevier Inc.      Agustina Caroli, MD [facility]

## 2019-02-18 NOTE — Patient Instructions (Addendum)
If you have lab work done today you will be contacted with your lab results within the next 2 weeks.  If you have not heard from [place] then please contact us. The fastest way to get your results is to register for My Chart.   IF you received an x-ray today, you will receive an invoice from [facility]. Please contact [facility] at [phone] with questions or concerns regarding your invoice.   IF you received labwork today, you will receive an invoice from [facility]. Please contact [facility] at [phone] with questions or concerns regarding your invoice.   Our billing staff will not be able to assist you with questions regarding bills from these companies.  You will be contacted with the lab results as soon as they are available. The fastest way to get your results is to activate your My Chart account. Instructions are located on the last page of this paperwork. If you have not heard from [place] regarding the results in 2 weeks, please contact this office.       Shoulder Pain Many things can cause shoulder pain, including:  An injury.  Moving the shoulder in the same way again and again (overuse).  Joint pain (arthritis). Pain can come from:  Swelling and irritation (inflammation) of any part of the shoulder.  An injury to the shoulder joint.  An injury to: ? Tissues that connect muscle to bone (tendons). ? Tissues that connect bones to each other (ligaments). ? Bones. Follow these instructions at home: Watch for changes in your symptoms. Let your doctor know about them. Follow these instructions to help with your pain. If you have a sling:  Wear the sling as told by your doctor. Remove it only as told by your doctor.  Loosen the sling if your fingers: ? Tingle. ? Become numb. ? Turn cold and blue.  Keep the sling clean.  If the sling is not waterproof: ? Do not let it get wet. ? Take the sling off when you shower or bathe. Managing pain,  stiffness, and swelling   If told, put ice on the painful area: ? Put ice in a plastic bag. ? Place a towel between your skin and the bag. ? Leave the ice on for 20 minutes, 2-3 times a day. Stop putting ice on if it does not help with the pain.  Squeeze a soft ball or a foam pad as much as possible. This prevents swelling in the shoulder. It also helps to strengthen the arm. General instructions  Take over-the-counter and prescription medicines only as told by your doctor.  Keep all follow-up visits as told by your doctor. This is important. Contact a doctor if:  Your pain gets worse.  Medicine does not help your pain.  You have new pain in your arm, hand, or fingers. Get help right away if:  Your arm, hand, or fingers: ? Tingle. ? Are numb. ? Are swollen. ? Are painful. ? Turn white or blue. Summary  Shoulder pain can be caused by many things. These include injury, moving the shoulder in the same away again and again, and joint pain.  Watch for changes in your symptoms. Let your doctor know about them.  This condition may be treated with a sling, ice, and pain medicine.  Contact your doctor if the pain gets worse or you have new pain. Get help right away if your arm, hand, or fingers tingle or get numb, swollen, or painful.  Keep all  follow-up visits as told by your doctor. This is important. This information is not intended to replace advice given to you by your health care provider. Make sure you discuss any questions you have with your health care provider. Document Released: 05/06/2008 Document Revised: 06/02/2018 Document Reviewed: 06/02/2018 Elsevier Interactive Patient Education  2019 [employer].

## 2019-07-04 ENCOUNTER — Ambulatory Visit
Admission: RE | Admit: 2019-07-04 | Discharge: 2019-07-04 | Disposition: A | Discharge status: Home / Self Care | Payer: 59 | Source: Ambulatory Visit | Attending: Emergency Medicine | Admitting: Emergency Medicine

## 2019-07-04 ENCOUNTER — Other Ambulatory Visit: Payer: Self-pay

## 2019-07-04 DIAGNOSIS — M75112 Incomplete rotator cuff tear or rupture of left shoulder, not specified as traumatic: Secondary | ICD-10-CM | POA: Diagnosis not present

## 2019-07-04 DIAGNOSIS — M25512 Pain in left shoulder: Secondary | ICD-10-CM

## 2019-07-04 DIAGNOSIS — M19012 Primary osteoarthritis, left shoulder: Secondary | ICD-10-CM | POA: Diagnosis not present

## 2019-07-04 DIAGNOSIS — M7552 Bursitis of left shoulder: Secondary | ICD-10-CM | POA: Diagnosis not present

## 2019-07-05 NOTE — Progress Notes (Signed)
Lm for pt to call back for mri results

## 2019-07-07 ENCOUNTER — Other Ambulatory Visit: Payer: Self-pay

## 2019-07-07 ENCOUNTER — Encounter: Payer: Self-pay | Admitting: Orthopaedic Surgery

## 2019-07-07 ENCOUNTER — Ambulatory Visit: Payer: 59 | Admitting: Orthopaedic Surgery

## 2019-07-07 VITALS — BP 144/84 | HR 76 | Ht 61.0 in | Wt 145.0 lb

## 2019-07-07 DIAGNOSIS — M25512 Pain in left shoulder: Secondary | ICD-10-CM

## 2019-07-07 DIAGNOSIS — G8929 Other chronic pain: Secondary | ICD-10-CM

## 2019-07-07 NOTE — Progress Notes (Signed)
Office Visit Note   Patient: Vanessa Nixon           Date of Birth: Apr 03, 1961           MRN: [id number] Visit Date: 07/07/2019              Requested by: Horald Pollen, MD [address],  [place] [address] PCP: Horald Pollen, MD   Assessment & Plan: Visit Diagnoses:  1. Chronic left shoulder pain   2. Acute pain of left shoulder     Plan: MRI scan of left shoulder performed to her primary care physician's office demonstrates thickening of the capsular structures consistent with adhesive capsulitis.  Vanessa Nixon does have evidence of adhesive capsulitis and we will try a course of physical therapy.  Long discussion regarding other treatment options including cortisone injection and manipulation.  She would prefer to try the therapy first.  There was moderate rotator cuff tendinopathy but no full-thickness tear.  There were some partial-thickness bursal surface tears involving the supraspinatus tendon and mild AC joint arthritis.  Long head of the biceps was intact  Follow-Up Instructions: Return if symptoms worsen or fail to improve.   Orders:  Orders Placed This Encounter  Procedures  . Ambulatory referral to Physical Therapy   No orders of the defined types were placed in this encounter.     Procedures: No procedures performed   Clinical Data: No additional findings.   Subjective: Chief Complaint  Patient presents with  . Left Shoulder - Pain  Patient presents today with left shoulder pain. She painted her deck one year ago with no problems. A few days later she reached back in her car and felt a "jolt of electricity" in her shoulder. She has a knot on the backside of her shoulder that she was told may be a hematoma. She has decreased range of motion. She occasionally has pain in her shoulder with no reason. She does not take anything for pain. She is right hand dominant. She saw her PCP and had an MRI on 07/04/2019. No previous x-rays.  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  Not having any pain and mass has not enlarged  HPI  Review of Systems   Objective: Vital Signs: BP (!) 144/84   Pulse 76   Ht 5\' 1"  (1.549 m)   Wt 145 lb (65.8 kg)   LMP 06/03/2019   BMI 27.40 kg/m   Physical Exam Constitutional:      Appearance: She is well-developed.  Eyes:     Pupils: Pupils are equal, round, and reactive to light.  Pulmonary:     Effort: Pulmonary effort is normal.  Skin:    General: Skin is warm and dry.  Neurological:     Mental Status: She is alert and oriented to person, place, and time.  Psychiatric:        Behavior: Behavior normal.     Ortho Exam left shoulder with some loss of flexion external rotation and internal rotation compared to the right shoulder consistent with adhesive capsulitis.  Lacked about 30 degrees of full overhead.  Also lacked about 25 to 30 degrees of external rotation compared to the opposite side.  Could barely touch the lower lumbar spine.  Biceps intact skin intact.  Palpable lipoma was not painful in the posterior aspect  of the shoulder  Specialty Comments:  No specialty comments available.  Imaging: No results found.   PMFS History: Patient Active Problem List   Diagnosis Date Noted  . Acute pain of left shoulder 02/18/2019  . Strain of left shoulder 02/18/2019  . Cough 12/03/2018  . Bacterial pneumonia 12/03/2018   Past Medical History:  Diagnosis Date  . Allergy   . Anemia   . Arthritis     Family History  Problem Relation Age of Onset  . COPD Mother   . Arthritis Mother   . Breast cancer Mother 38  . Alzheimer's disease Father   . Cancer Father        Prostate,Multiple myloma  . Cancer Sister        Liver  . Breast cancer Sister 4  . Colon cancer Paternal Aunt   . Colon polyps Neg Hx   . Esophageal cancer Neg Hx   . Rectal cancer  Neg Hx   . Stomach cancer Neg Hx     Past Surgical History:  Procedure Laterality Date  . CESAREAN SECTION    . COLONOSCOPY     30 plus years ago  . WISDOM TOOTH EXTRACTION     Social History   Occupational History  . Not on file  Tobacco Use  . Smoking status: Never Smoker  . Smokeless tobacco: Never Used  Substance and Sexual Activity  . Alcohol use: Never    Alcohol/week: 0.0 standard drinks    Frequency: Never  . Drug use: No  . Sexual activity: Yes    Partners: Male    Birth control/protection: Condom    Comment: with monogamous husband-1st intercourse 19 yo-Fewer than 5 partners

## 2019-07-19 DIAGNOSIS — M25512 Pain in left shoulder: Secondary | ICD-10-CM | POA: Diagnosis not present

## 2019-07-19 DIAGNOSIS — M7502 Adhesive capsulitis of left shoulder: Secondary | ICD-10-CM | POA: Diagnosis not present

## 2019-07-27 DIAGNOSIS — M25512 Pain in left shoulder: Secondary | ICD-10-CM | POA: Diagnosis not present

## 2019-07-27 DIAGNOSIS — M7502 Adhesive capsulitis of left shoulder: Secondary | ICD-10-CM | POA: Diagnosis not present

## 2019-08-03 DIAGNOSIS — M7502 Adhesive capsulitis of left shoulder: Secondary | ICD-10-CM | POA: Diagnosis not present

## 2019-08-03 DIAGNOSIS — M25512 Pain in left shoulder: Secondary | ICD-10-CM | POA: Diagnosis not present

## 2019-08-05 DIAGNOSIS — M7502 Adhesive capsulitis of left shoulder: Secondary | ICD-10-CM | POA: Diagnosis not present

## 2019-08-05 DIAGNOSIS — M25512 Pain in left shoulder: Secondary | ICD-10-CM | POA: Diagnosis not present

## 2019-08-12 DIAGNOSIS — M7502 Adhesive capsulitis of left shoulder: Secondary | ICD-10-CM | POA: Diagnosis not present

## 2019-08-12 DIAGNOSIS — M25512 Pain in left shoulder: Secondary | ICD-10-CM | POA: Diagnosis not present

## 2019-08-17 DIAGNOSIS — M25512 Pain in left shoulder: Secondary | ICD-10-CM | POA: Diagnosis not present

## 2019-08-17 DIAGNOSIS — M7502 Adhesive capsulitis of left shoulder: Secondary | ICD-10-CM | POA: Diagnosis not present

## 2019-08-25 DIAGNOSIS — M7502 Adhesive capsulitis of left shoulder: Secondary | ICD-10-CM | POA: Diagnosis not present

## 2019-08-25 DIAGNOSIS — M25512 Pain in left shoulder: Secondary | ICD-10-CM | POA: Diagnosis not present

## 2019-08-31 ENCOUNTER — Encounter: Payer: Self-pay | Admitting: Gynecology

## 2019-10-12 ENCOUNTER — Telehealth: Payer: Self-pay | Admitting: *Deleted

## 2019-10-12 NOTE — Telephone Encounter (Signed)
I had recommended sonohysterogram.  As I am retiring I do not think it would be appropriate for me to do the sonohysterogram as I would not be able to do surgery if needed.  My recommendation would be for her to make an appointment to see Dr Dellis Filbert for her annual to establish care and then they can schedule testing as a feel appropriate.

## 2019-10-12 NOTE — Telephone Encounter (Signed)
Pt hasnt seen you since July 2019 at that time you ordered an [place] due to menorrhagia.  She never followed up with the [place].  Wants it ordered now.  I advised to make an appointment for reevaluation since it has been 16 months since we have seen her. But she wants [place] first as states nothing has changed since then.  Please advise OV first or [place] then OV? Sharrie Rothman CMA

## 2019-10-12 NOTE — Telephone Encounter (Signed)
Pt called requesting an order be sent to [facility] for an ultrasound and ordered in July 2019 visit with Dr. Phineas Real. I advised needs to be reevaluated prior to [place] being ordered. Left message on her VM. KW CMA

## 2019-10-14 NOTE — Telephone Encounter (Signed)
Pt informed KW CMA 

## 2019-11-17 IMAGING — MR MRI OF THE LEFT SHOULDER WITHOUT CONTRAST
5 series · 35 of 40 positions shown · non-contrast
Comparison: None.

CLINICAL DATA: Left shoulder pain for 11 months. Pain after
painting a deck.

EXAM:
MRI OF THE LEFT SHOULDER WITHOUT CONTRAST
TECHNIQUE: Multiplanar, multisequence MR imaging of the shoulder was performed.
No intravenous contrast was administered.

[Series 3: PD fat-sat · axial · 4.0mm · 0.55mm/px · z∈[+23,+113]mm · 8 of 20 slices shown (1 of 2)]
[im 1/20]
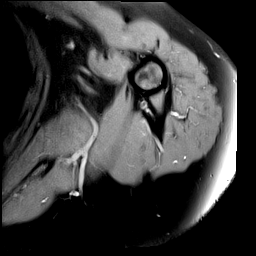
[im 3/20]
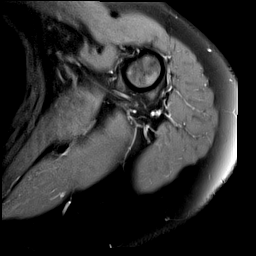
[im 6/20]
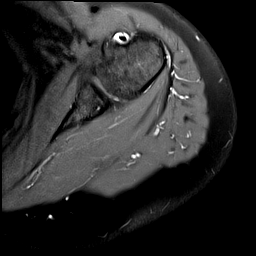
[im 9/20]
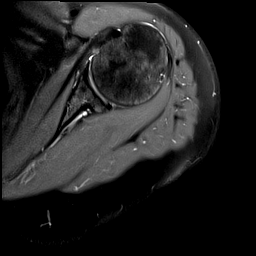
[im 11/20]
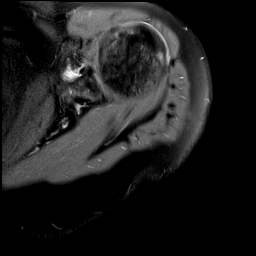
[im 14/20]
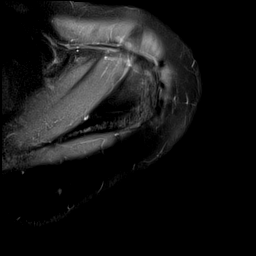
[im 17/20]
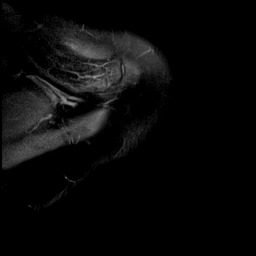
[im 20/20]
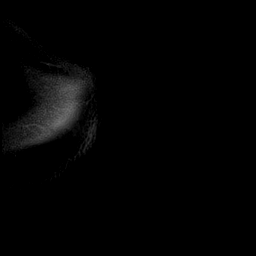

[Series 4: T2 fat-sat · sagittal · 4.0mm · 0.55mm/px · 8 of 18 slices shown (1 of 2)]
[im 1/18]
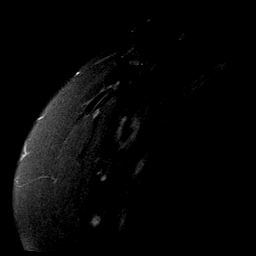
[im 3/18]
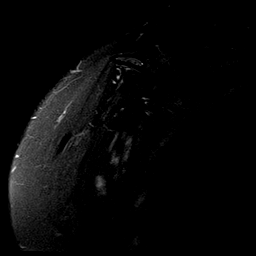
[im 5/18]
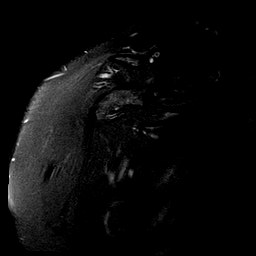
[im 8/18]
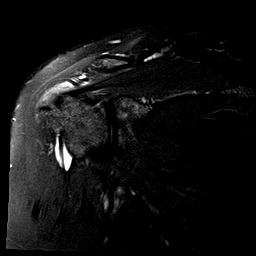
[im 10/18]
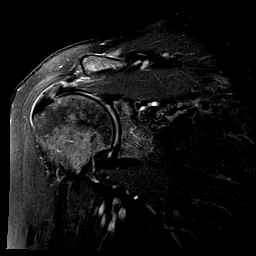
[im 13/18]
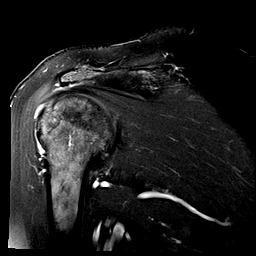
[im 15/18]
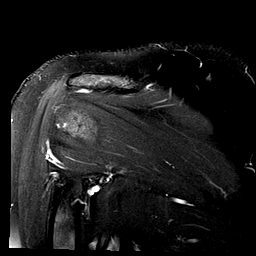
[im 18/18]
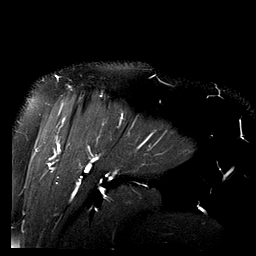

[Series 5: PD fat-sat · sagittal · 4.0mm · 0.27mm/px · 8 of 18 slices shown (2 of 2)]
[im 1/18]
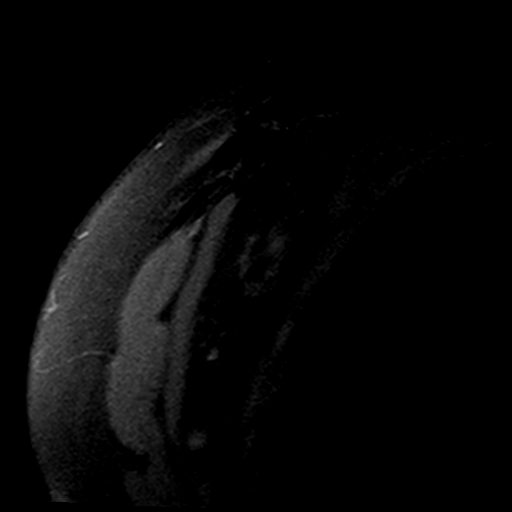
[im 3/18]
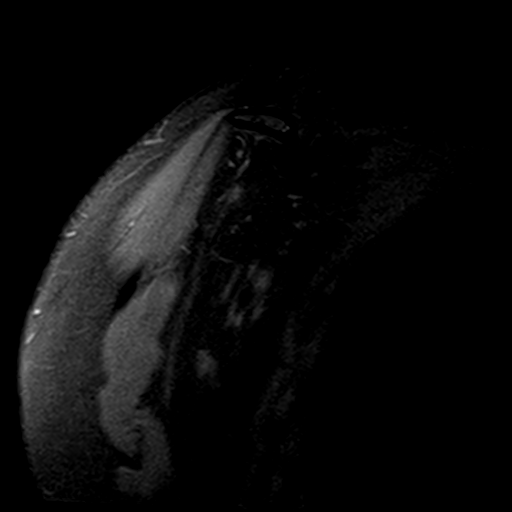
[im 5/18]
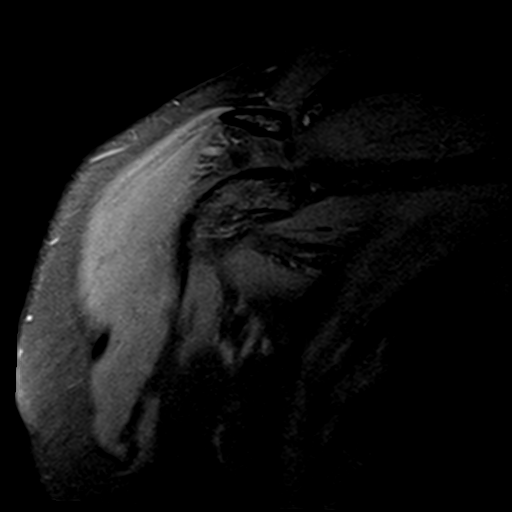
[im 8/18]
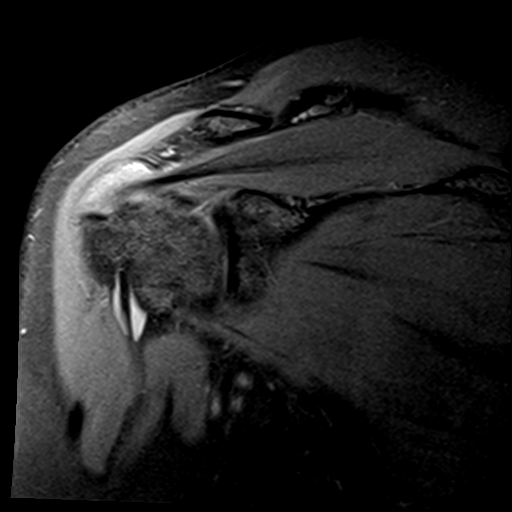
[im 10/18]
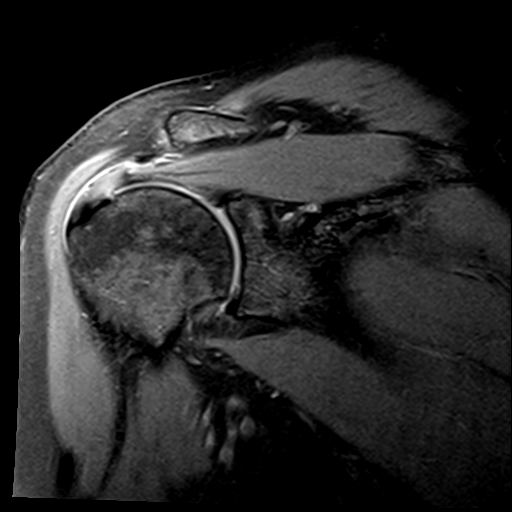
[im 13/18]
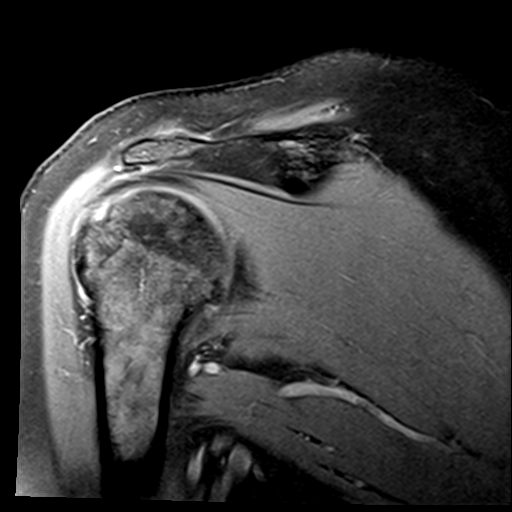
[im 15/18]
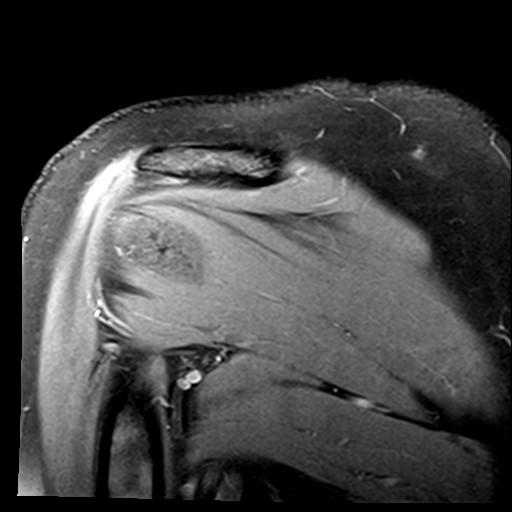
[im 18/18]
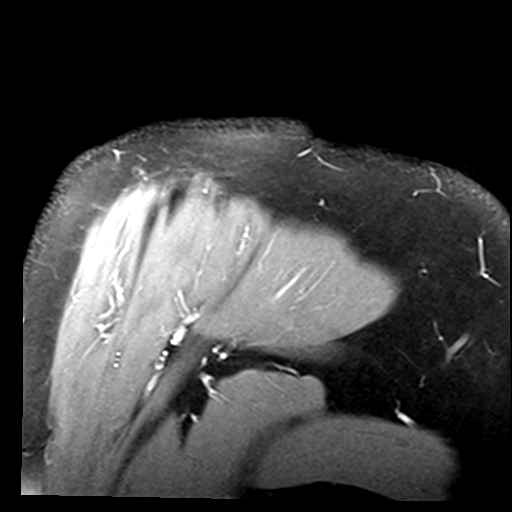

[Series 6: T1 · coronal · 4.0mm · 0.27mm/px · 3 of 20 slices shown]
[im 1/20]
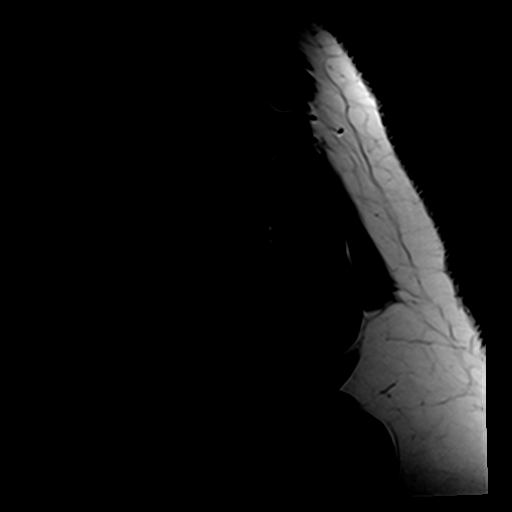
[im 3/20]
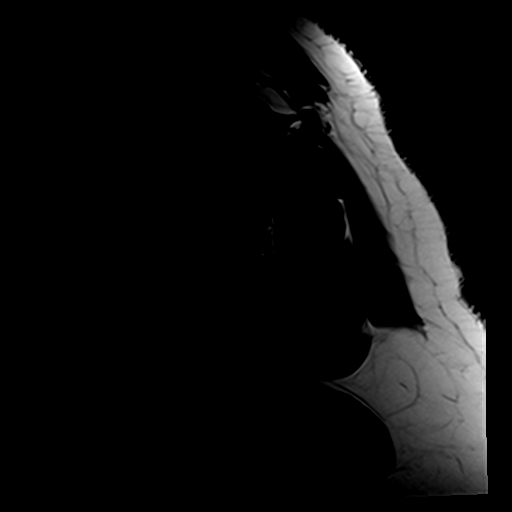
[im 6/20]
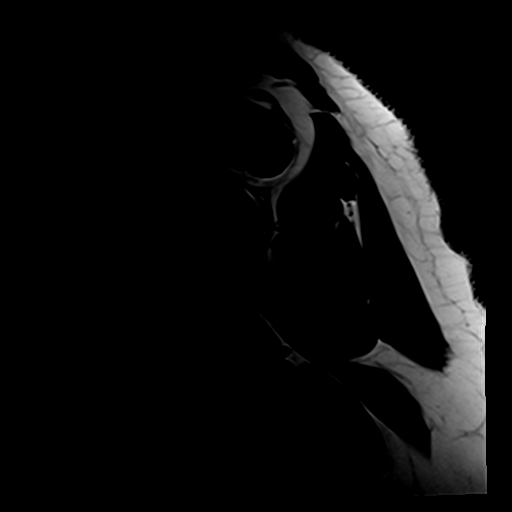

[Series 7: T2 fat-sat · coronal · 4.0mm · 0.55mm/px · 8 of 20 slices shown (2 of 2)]
[im 1/20]
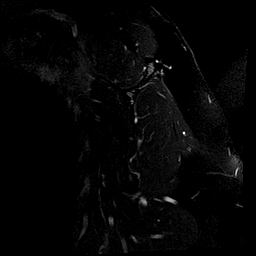
[im 3/20]
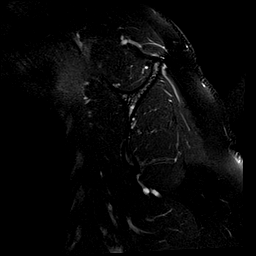
[im 6/20]
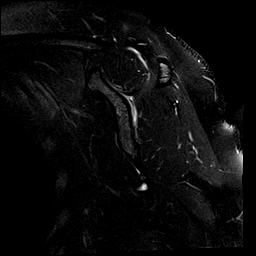
[im 9/20]
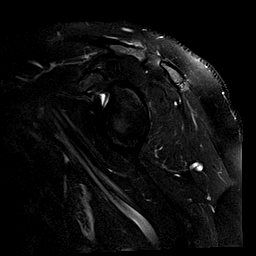
[im 11/20]
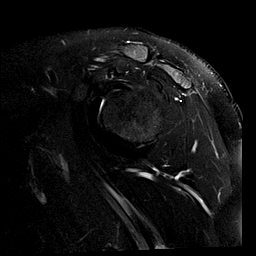
[im 14/20]
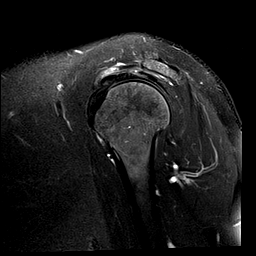
[im 17/20]
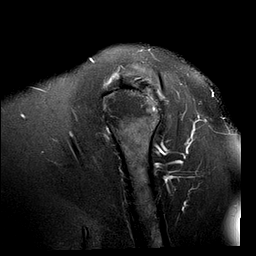
[im 20/20]
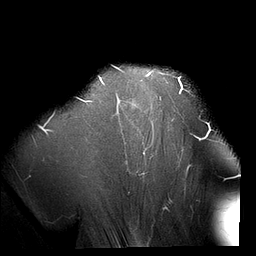

[35 of 40 positions shown; findings below may reference images not displayed]

FINDINGS: Rotator cuff: Moderate rotator cuff tendinopathy/tendinosis mainly
involving the supraspinatus and infraspinatus tendons. There is a
partial thickness bursal surface tear in the critical zone region of
the supraspinatus tendon. Maximum laminar retraction of the bursal
fibers is 8 mm. No full-thickness retracted tear.

The subscapularis tendon is intact.

Muscles:  Normal.

Biceps long head:  Intact

Acromioclavicular Joint: Mild degenerative changes. Type 1-2
acromion. Mild lateral downsloping without definite undersurface
spurring.

Glenohumeral Joint: Minimal degenerative changes with mild cartilage
thinning. No joint effusion or loose bodies. There is thickening of
the capsular structures in the axillary recess which can be seen
with adhesive capsulitis or synovitis.

Labrum:  No definite labral tears.

Bones:  No acute bony findings.

Other: Mild/moderate subacromial/subdeltoid bursitis.
IMPRESSION: 1. Moderate rotator cuff tendinopathy/tendinosis. Partial-thickness
bursal surface tear involving the supraspinatus tendon. No
full-thickness retracted tear.
2. Intact long head biceps tendon and glenoid labrum.
3. Mild AC joint degenerative changes and mild lateral downsloping
of a type 1-2 acromion.
4. There is thickening of the capsular structures in the axillary
recess which can be seen with adhesive capsulitis or synovitis.
5. Mild to moderate subacromial/subdeltoid bursitis.

## 2020-01-01 IMAGING — US US EXTREM UP*L* LTD
1 series · 13 of 13 positions shown · non-contrast
Comparison: None.

CLINICAL DATA: Palpable left upper arm mass.

EXAM:
ULTRASOUND left UPPER EXTREMITY LIMITED
TECHNIQUE: Ultrasound examination of the upper extremity soft tissues was
performed in the area of clinical concern.

[Series 1: us extrem up*left* ltd · 0.05mm/px · 13 acquisitions, 13 frames shown]
[im 1/13]
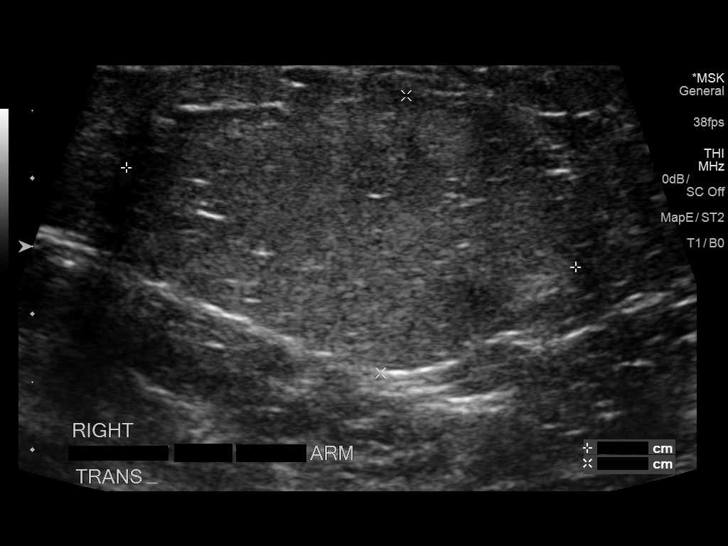
[im 2/13]
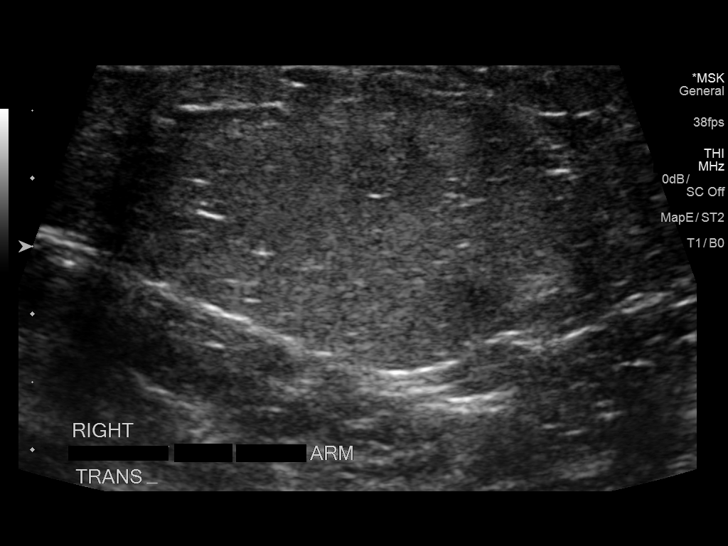
[im 3/13]
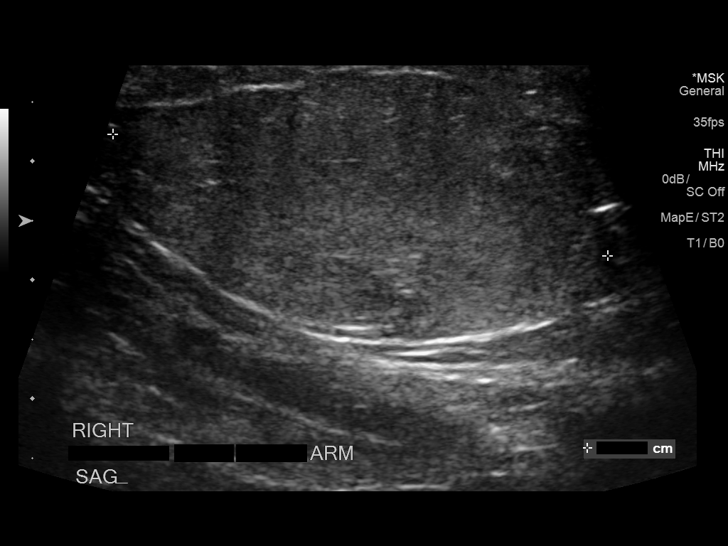
[im 4/13]
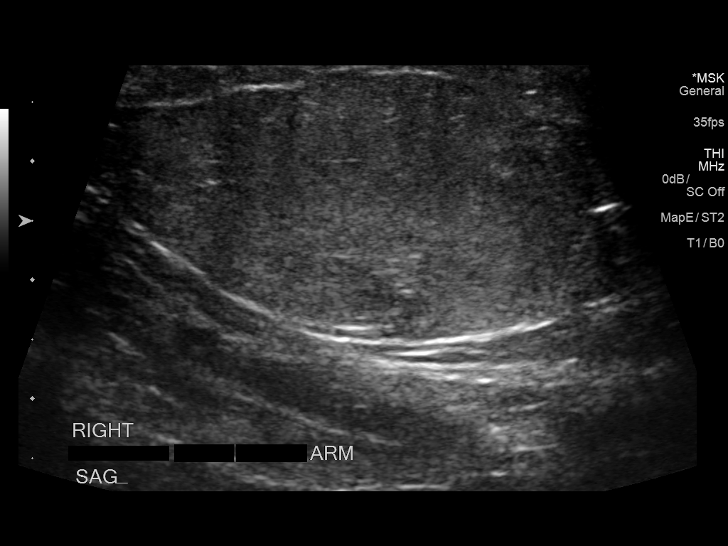
[im 5/13]
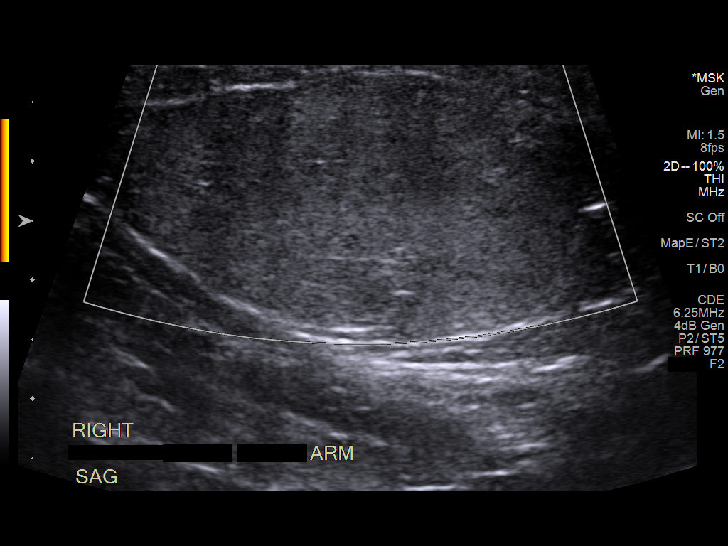
[im 6/13]
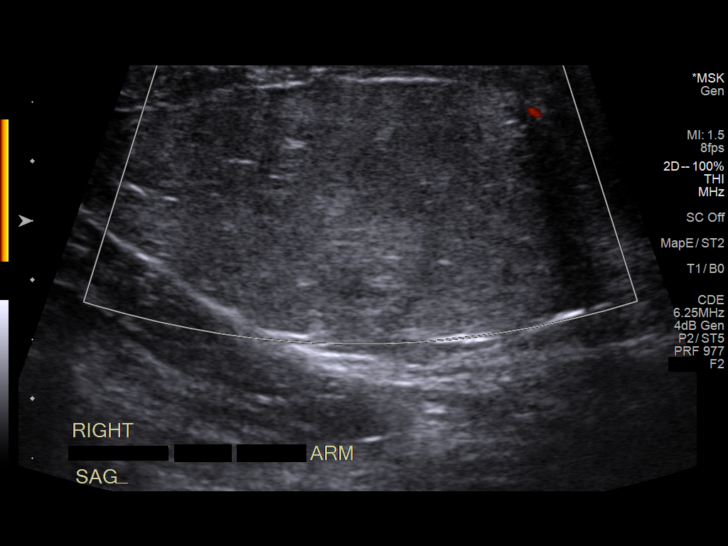
[im 7/13]
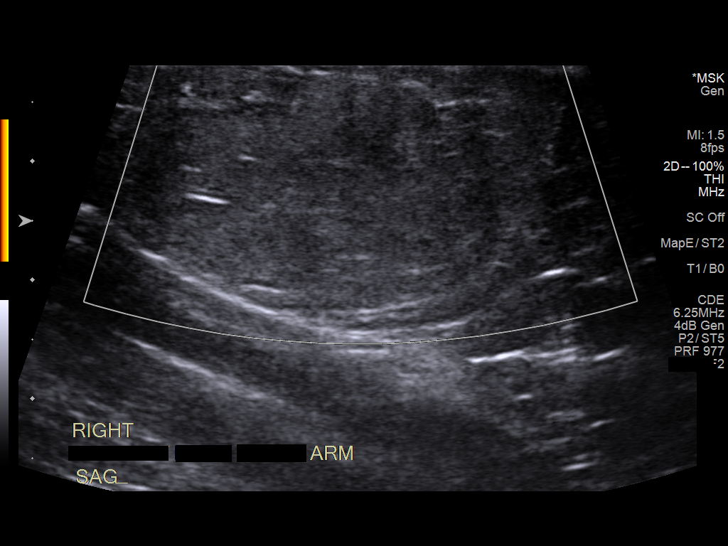
[im 8/13]
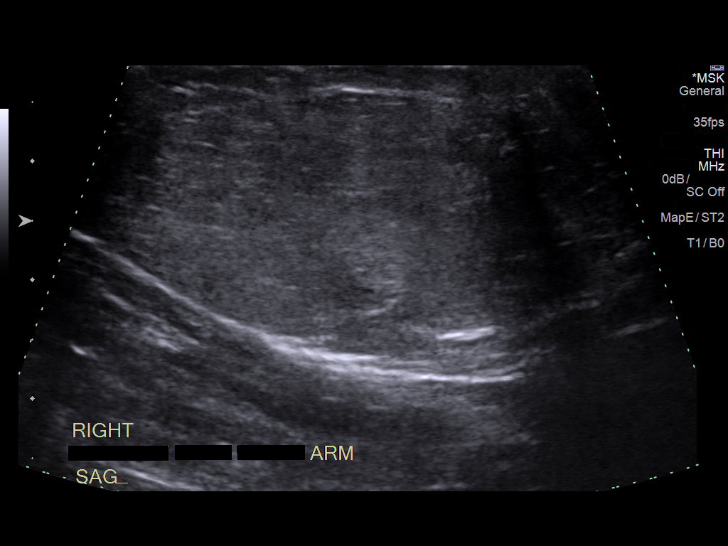
[im 9/13]
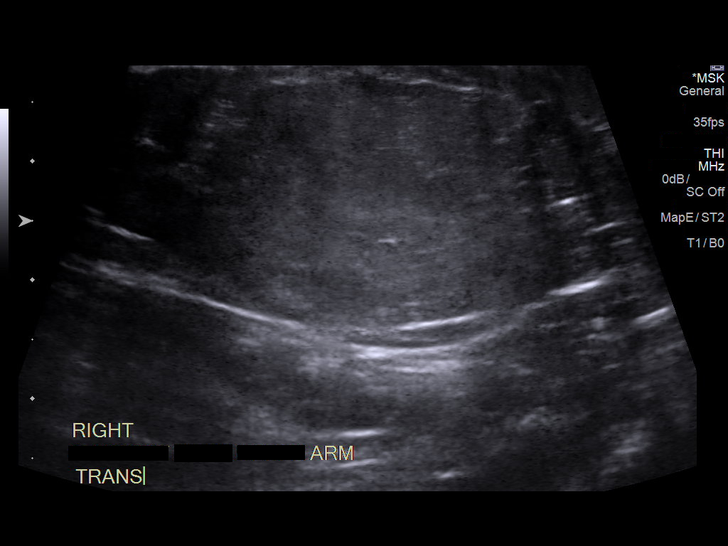
[im 10/13]
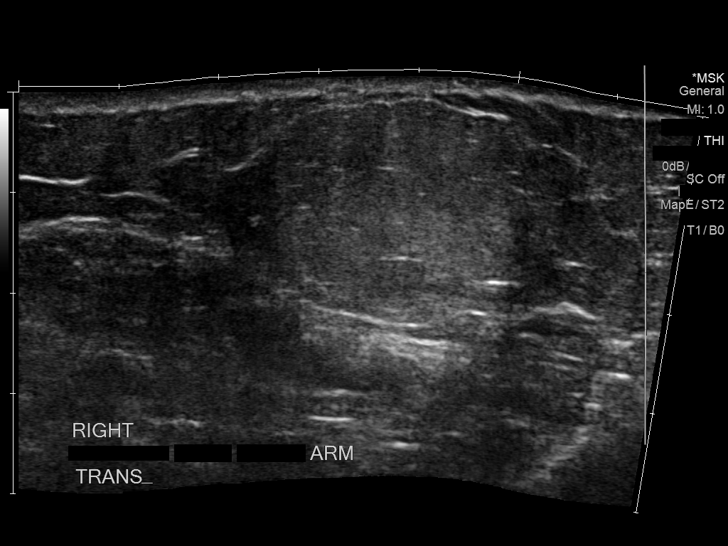
[im 11/13]
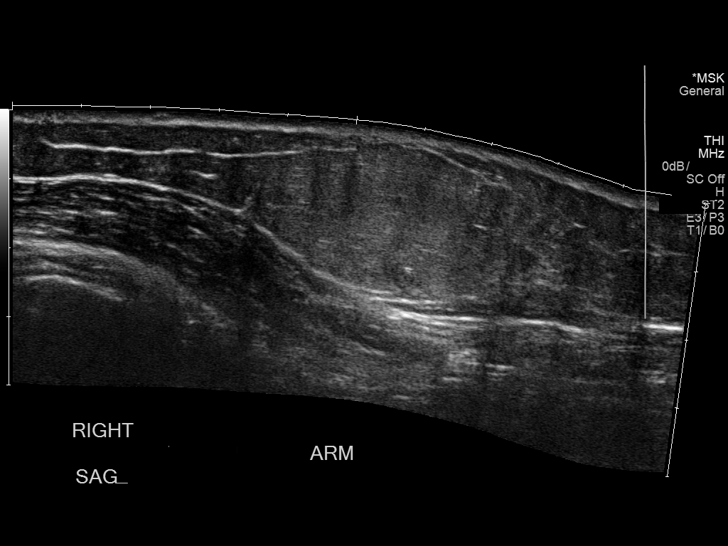
[im 12/13]
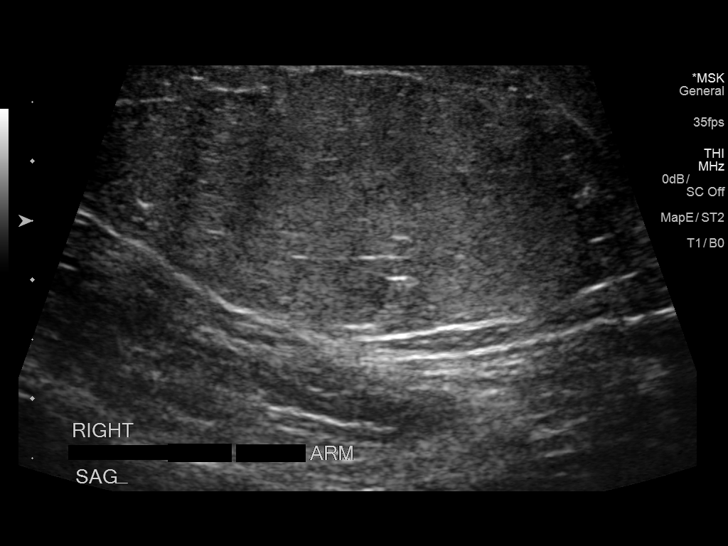
[im 13/13]
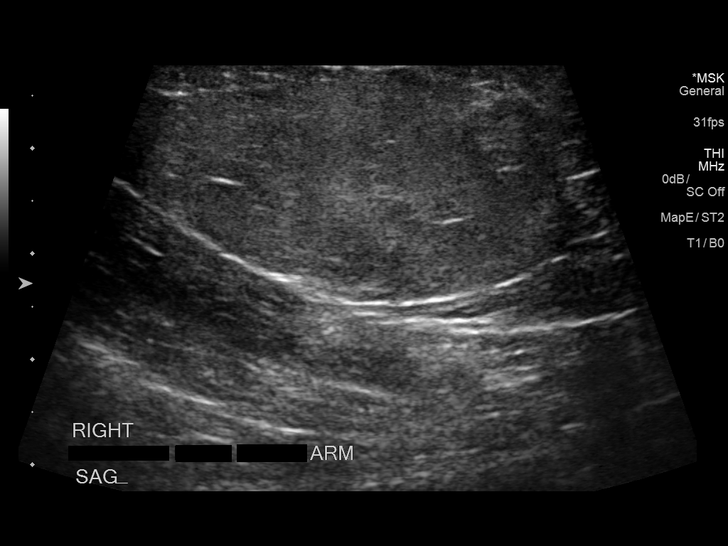

[13 of 13 positions shown; findings below may reference images not displayed]

FINDINGS: The patient's palpable abnormality corresponds to a
well-circumscribed and likely encapsulated solid mass measuring
x 2.1 x 4.3 cm. Somewhat heterogeneous echogenicity and the lesion
is more echogenic than the surrounding subcutaneous fat and
underlying subcutaneous muscle. This could be a lipoma but other
solid mass is not excluded. Recommend MR imaging without and with
contrast for further evaluation. No definite internal blood flow is
identified.
IMPRESSION: 1. 3.4 x 2.1 x 4.3 cm soft tissue mass corresponding to the
patient's palpable abnormality in the left shoulder area.
2. Recommend MR imaging without and with contrast for further
evaluation.

## 2020-03-29 ENCOUNTER — Other Ambulatory Visit: Payer: Self-pay

## 2020-03-29 ENCOUNTER — Encounter: Payer: Self-pay | Admitting: Emergency Medicine

## 2020-03-29 ENCOUNTER — Ambulatory Visit (INDEPENDENT_AMBULATORY_CARE_PROVIDER_SITE_OTHER): Payer: 59

## 2020-03-29 ENCOUNTER — Telehealth: Payer: Self-pay | Admitting: Emergency Medicine

## 2020-03-29 ENCOUNTER — Ambulatory Visit: Payer: 59 | Admitting: Emergency Medicine

## 2020-03-29 VITALS — BP 151/70 | HR 80 | Temp 98.3°F | Ht 61.0 in | Wt 151.4 lb

## 2020-03-29 DIAGNOSIS — M2550 Pain in unspecified joint: Secondary | ICD-10-CM

## 2020-03-29 DIAGNOSIS — M79641 Pain in right hand: Secondary | ICD-10-CM

## 2020-03-29 DIAGNOSIS — Z0000 Encounter for general adult medical examination without abnormal findings: Secondary | ICD-10-CM

## 2020-03-29 NOTE — Progress Notes (Signed)
Vanessa Nixon 59 y.o.   Chief Complaint  Patient presents with  . right finger pain    pinky finger and the thumb     HISTORY OF PRESENT ILLNESS: This is a 59 y.o. female complaining of pain to right hand thumb and pinky finger for several days if not weeks. Denies injury or any other associated symptoms.  HPI   Prior to Admission medications   Medication Sig Start Date End Date Taking? Authorizing Provider  IRON PO Take by mouth.   Yes [provider]  Multiple Vitamins-Minerals (MULTIVITAMIN WITH MINERALS) tablet Take 1 tablet by mouth daily.   Yes [provider]    Allergies  Allergen Reactions  . Codeine Itching    There are no problems to display for this patient.   Past Medical History:  Diagnosis Date  . Allergy   . Anemia   . Arthritis     Past Surgical History:  Procedure Laterality Date  . CESAREAN SECTION    . COLONOSCOPY     30 plus years ago  . WISDOM TOOTH EXTRACTION      Social History   Socioeconomic History  . Marital status: Married    Spouse name: Not on file  . Number of children: 5  . Years of education: Not on file  . Highest education level: Bachelor's degree (e.g., BA, AB, BS)  Occupational History  . Not on file  Tobacco Use  . Smoking status: Never Smoker  . Smokeless tobacco: Never Used  Substance and Sexual Activity  . Alcohol use: Never    Alcohol/week: 0.0 standard drinks  . Drug use: No  . Sexual activity: Yes    Partners: Male    Birth control/protection: Condom    Comment: with monogamous husband-1st intercourse 19 yo-Fewer than 5 partners  Other Topics Concern  . Not on file  Social History Narrative   Pt is from [place]. Works at [employer] with [employer]. Lives at home with husband and 4 of 5 children.    Social Determinants of Health   Financial Resource Strain:   . Difficulty of Paying Living Expenses:   Food Insecurity:   . Worried About Charity fundraiser in the Last Year:     . Arboriculturist in the Last Year:   Transportation Needs:   . Film/video editor (Medical):   Marland Kitchen Lack of Transportation (Non-Medical):   Physical Activity:   . Days of Exercise per Week:   . Minutes of Exercise per Session:   Stress:   . Feeling of Stress :   Social Connections:   . Frequency of Communication with Friends and Family:   . Frequency of Social Gatherings with Friends and Family:   . Attends Religious Services:   . Active Member of Clubs or Organizations:   . Attends Archivist Meetings:   Marland Kitchen Marital Status:   Intimate Partner Violence:   . Fear of Current or Ex-Partner:   . Emotionally Abused:   Marland Kitchen Physically Abused:   . Sexually Abused:     Family History  Problem Relation Age of Onset  . COPD Mother   . Arthritis Mother   . Breast cancer Mother 50  . Alzheimer's disease Father   . Cancer Father        Prostate,Multiple myloma  . Cancer Sister        Liver  . Breast cancer Sister 20  . Colon cancer Paternal Aunt   . Colon  polyps Neg Hx   . Esophageal cancer Neg Hx   . Rectal cancer Neg Hx   . Stomach cancer Neg Hx      Review of Systems  Constitutional: Negative.  Negative for chills and fever.  HENT: Negative.  Negative for congestion and sore throat.   Respiratory: Negative.  Negative for cough and shortness of breath.   Cardiovascular: Negative.  Negative for chest pain and palpitations.  Gastrointestinal: Negative.  Negative for abdominal pain, diarrhea, nausea and vomiting.  Genitourinary: Negative.   Musculoskeletal: Positive for joint pain.  Skin: Negative.  Negative for rash.  Neurological: Negative.  Negative for dizziness and headaches.  Endo/Heme/Allergies: Negative.   All other systems reviewed and are negative.  Today's Vitals   03/29/20 1537  BP: (!) 151/70  Pulse: 80  Temp: 98.3 F (36.8 C)  TempSrc: Temporal  SpO2: 98%  Weight: 151 lb 6.4 oz (68.7 kg)  Height: 5\' 1"  (1.549 m)   Body mass index is 28.61  kg/m.   Physical Exam Vitals reviewed.  Constitutional:      Appearance: Normal appearance.  HENT:     Head: Normocephalic.  Eyes:     Extraocular Movements: Extraocular movements intact.     Pupils: Pupils are equal, round, and reactive to light.  Cardiovascular:     Rate and Rhythm: Normal rate.  Pulmonary:     Effort: Pulmonary effort is normal.  Musculoskeletal:     Cervical back: Normal range of motion.     Comments: Right hand: Hard nontender lump to lateral distal dorsal surface of thumb.  Full range of motion.  Neurovascularly intact.  Fifth finger: Some swelling and tenderness to DIP joint.  Full range of motion  Skin:    General: Skin is warm and dry.     Capillary Refill: Capillary refill takes less than 2 seconds.  Neurological:     General: No focal deficit present.     Mental Status: She is alert and oriented to person, place, and time.  Psychiatric:        Mood and Affect: Mood normal.        Behavior: Behavior normal.    DG Hand Complete Right  Result Date: 03/29/2020 CLINICAL DATA:  Right hand pain. EXAM: RIGHT HAND - COMPLETE 3+ VIEW COMPARISON:  Right ring finger x-rays dated January 22, 2017. FINDINGS: No acute fracture or dislocation. Joint spaces are preserved. Mild marginal spurring of the thumb IP joint. Bone mineralization is normal. Soft tissues are unremarkable. IMPRESSION: 1. Mild degenerative changes of the thumb IP joint. Electronically Signed   By: Titus Dubin M.D.   On: 03/29/2020 16:20     ASSESSMENT & PLAN: Vanessa Nixon was seen today for right finger pain.  Diagnoses and all orders for this visit:  Right hand pain -     DG Hand Complete Right  Arthralgia of multiple joints -     ANA,IFA RA Diag Pnl w/rflx Tit/Patn -     Sedimentation Rate -     CBC with Differential/Platelet -     Comprehensive metabolic panel    Patient Instructions       If you have lab work done today you will be contacted with your lab results within the  next 2 weeks.  If you have not heard from [place] then please contact us. The fastest way to get your results is to register for My Chart.   IF you received an x-ray today, you will receive an invoice from  [facility]. Please contact [facility] at [phone] with questions or concerns regarding your invoice.   IF you received labwork today, you will receive an invoice from [facility]. Please contact [facility] at [phone] with questions or concerns regarding your invoice.   Our billing staff will not be able to assist you with questions regarding bills from these companies.  You will be contacted with the lab results as soon as they are available. The fastest way to get your results is to activate your My Chart account. Instructions are located on the last page of this paperwork. If you have not heard from [place] regarding the results in 2 weeks, please contact this office.      Joint Pain  Joint pain can be caused by many things. It is likely to go away if you follow instructions from your doctor for taking care of yourself at home. Sometimes, you may need more treatment. Follow these instructions at home: Managing pain, stiffness, and swelling   If told, put ice on the painful area. ? Put ice in a plastic bag. ? Place a towel between your skin and the bag. ? Leave the ice on for 20 minutes, 2-3 times a day.  If told, put heat on the painful area. Do this as often as told by your doctor. Use the heat source that your doctor recommends, such as a moist heat pack or a heating pad. ? Place a towel between your skin and the heat source. ? Leave the heat on for 20-30 minutes. ? Take off the heat if your skin gets bright red. This is especially important if you are unable to feel pain, heat, or cold. You may have a greater risk of getting burned.  Move your fingers or toes below the painful joint often. This helps with stiffness and swelling.  If possible, raise (elevate) the  painful joint above the level of your heart while you are sitting or lying down. To do this, try putting a few pillows under the painful joint. Activity  Rest the painful joint for as long as told. Do not do things that cause pain or make your pain worse.  Begin exercising or stretching the affected area, as told by your doctor. Ask your doctor what types of exercise are safe for you. If you have an elastic bandage, sling, or splint:  Wear the device as told by your doctor. Take it off only as told by your doctor.  Loosen the device if your fingers or toes below the joint: ? Tingle. ? Lose feeling (get numb). ? Get cold and blue.  Keep the device clean.  Ask your doctor if you should take off the device before bathing. You may need to cover it with a watertight covering when you take a bath or a shower. General instructions  Take over-the-counter and prescription medicines only as told by your doctor.  Do not use any products that contain nicotine or tobacco. These include cigarettes and e-cigarettes. If you need help quitting, ask your doctor.  Keep all follow-up visits as told by your doctor. This is important. Contact a doctor if:  You have pain that gets worse and does not get better with medicine.  Your joint pain does not get better in 3 days.  You have more bruising or swelling.  You have a fever.  You lose 10 lb (4.5 kg) or more without trying. Get help right away if:  You cannot move the joint.  Your fingers or toes  tingle, lose feeling, or get cold and blue.  You have a fever along with a joint that is red, warm, and swollen. Summary  Joint pain can be caused by many things. It often goes away if you follow instructions from your doctor for taking care of yourself at home.  Rest the painful joint for as long as told. Do not do things that cause pain or make your pain worse.  Take over-the-counter and prescription medicines only as told by your doctor. This  information is not intended to replace advice given to you by your health care provider. Make sure you discuss any questions you have with your health care provider. Document Revised: 10/31/2017 Document Reviewed: 09/03/2017 Elsevier Patient Education  2020 [employer]      Agustina Caroli, MD [facility]

## 2020-03-29 NOTE — Telephone Encounter (Signed)
03/29/2020 - PATIENT HAD AN OFFICE VISIT WITH DR. Kittie Plater ON Wednesday (03/29/2020). SHE STATED SHE WANTED TO SCHEDULE A COMPLETE PHYSICAL. HER PHYSICAL IS ON Thursday (05/04/2020). SHE NEEDS TO HAVE HER LAB ORDERS PUT IN AND IS SCHEDULED TO HAVE HER NURSE VISIT ON Friday (04/28/2020). [place]

## 2020-03-29 NOTE — Patient Instructions (Addendum)
If you have lab work done today you will be contacted with your lab results within the next 2 weeks.  If you have not heard from [place] then please contact us. The fastest way to get your results is to register for My Chart.   IF you received an x-ray today, you will receive an invoice from [facility]. Please contact [facility] at [phone] with questions or concerns regarding your invoice.   IF you received labwork today, you will receive an invoice from [facility]. Please contact [facility] at [phone] with questions or concerns regarding your invoice.   Our billing staff will not be able to assist you with questions regarding bills from these companies.  You will be contacted with the lab results as soon as they are available. The fastest way to get your results is to activate your My Chart account. Instructions are located on the last page of this paperwork. If you have not heard from [place] regarding the results in 2 weeks, please contact this office.      Joint Pain  Joint pain can be caused by many things. It is likely to go away if you follow instructions from your doctor for taking care of yourself at home. Sometimes, you may need more treatment. Follow these instructions at home: Managing pain, stiffness, and swelling   If told, put ice on the painful area. ? Put ice in a plastic bag. ? Place a towel between your skin and the bag. ? Leave the ice on for 20 minutes, 2-3 times a day.  If told, put heat on the painful area. Do this as often as told by your doctor. Use the heat source that your doctor recommends, such as a moist heat pack or a heating pad. ? Place a towel between your skin and the heat source. ? Leave the heat on for 20-30 minutes. ? Take off the heat if your skin gets bright red. This is especially important if you are unable to feel pain, heat, or cold. You may have a greater risk of getting burned.  Move your fingers or toes below the  painful joint often. This helps with stiffness and swelling.  If possible, raise (elevate) the painful joint above the level of your heart while you are sitting or lying down. To do this, try putting a few pillows under the painful joint. Activity  Rest the painful joint for as long as told. Do not do things that cause pain or make your pain worse.  Begin exercising or stretching the affected area, as told by your doctor. Ask your doctor what types of exercise are safe for you. If you have an elastic bandage, sling, or splint:  Wear the device as told by your doctor. Take it off only as told by your doctor.  Loosen the device if your fingers or toes below the joint: ? Tingle. ? Lose feeling (get numb). ? Get cold and blue.  Keep the device clean.  Ask your doctor if you should take off the device before bathing. You may need to cover it with a watertight covering when you take a bath or a shower. General instructions  Take over-the-counter and prescription medicines only as told by your doctor.  Do not use any products that contain nicotine or tobacco. These include cigarettes and e-cigarettes. If you need help quitting, ask your doctor.  Keep all follow-up visits as told by your doctor. This is important. Contact a doctor if:  You  have pain that gets worse and does not get better with medicine.  Your joint pain does not get better in 3 days.  You have more bruising or swelling.  You have a fever.  You lose 10 lb (4.5 kg) or more without trying. Get help right away if:  You cannot move the joint.  Your fingers or toes tingle, lose feeling, or get cold and blue.  You have a fever along with a joint that is red, warm, and swollen. Summary  Joint pain can be caused by many things. It often goes away if you follow instructions from your doctor for taking care of yourself at home.  Rest the painful joint for as long as told. Do not do things that cause pain or make your  pain worse.  Take over-the-counter and prescription medicines only as told by your doctor. This information is not intended to replace advice given to you by your health care provider. Make sure you discuss any questions you have with your health care provider. Document Revised: 10/31/2017 Document Reviewed: 09/03/2017 Elsevier Patient Education  [place].

## 2020-03-31 LAB — COMPREHENSIVE METABOLIC PANEL
ALT: 10 IU/L (ref 0–32)
AST: 17 IU/L (ref 0–40)
Albumin/Globulin Ratio: 1.8 (ref 1.2–2.2)
Albumin: 4.7 g/dL (ref 3.8–4.9)
Alkaline Phosphatase: 93 IU/L (ref 39–117)
BUN/Creatinine Ratio: 19 (ref 9–23)
BUN: 14 mg/dL (ref 6–24)
Bilirubin Total: 0.6 mg/dL (ref 0.0–1.2)
CO2: 25 mmol/L (ref 20–29)
Calcium: 9.8 mg/dL (ref 8.7–10.2)
Chloride: 100 mmol/L (ref 96–106)
Creatinine, Ser: 0.73 mg/dL (ref 0.57–1.00)
GFR calc Af Amer: 104 mL/min/{1.73_m2} (ref >59)
GFR calc non Af Amer: 90 mL/min/{1.73_m2} (ref >59)
Globulin, Total: 2.6 g/dL (ref 1.5–4.5)
Glucose: 85 mg/dL (ref 65–99)
Potassium: 4.4 mmol/L (ref 3.5–5.2)
Sodium: 140 mmol/L (ref 134–144)
Total Protein: 7.3 g/dL (ref 6.0–8.5)

## 2020-03-31 LAB — CBC WITH DIFFERENTIAL/PLATELET
Basophils Absolute: 0 10*3/uL (ref 0.0–0.2)
Basos: 0 %
EOS (ABSOLUTE): 0.2 10*3/uL (ref 0.0–0.4)
Eos: 3 %
Hematocrit: 38.6 % (ref 34.0–46.6)
Hemoglobin: 11.9 g/dL (ref 11.1–15.9)
Immature Grans (Abs): 0 10*3/uL (ref 0.0–0.1)
Immature Granulocytes: 0 %
Lymphocytes Absolute: 2.7 10*3/uL (ref 0.7–3.1)
Lymphs: 28 %
MCH: 21.4 pg — ABNORMAL LOW (ref 26.6–33.0)
MCHC: 30.8 g/dL — ABNORMAL LOW (ref 31.5–35.7)
MCV: 70 fL — ABNORMAL LOW (ref 79–97)
Monocytes Absolute: 0.5 10*3/uL (ref 0.1–0.9)
Monocytes: 5 %
Neutrophils Absolute: 6.2 10*3/uL (ref 1.4–7.0)
Neutrophils: 64 %
Platelets: 192 10*3/uL (ref 150–450)
RBC: 5.55 x10E6/uL — ABNORMAL HIGH (ref 3.77–5.28)
RDW: 15.8 % — ABNORMAL HIGH (ref 11.7–15.4)
WBC: 9.7 10*3/uL (ref 3.4–10.8)

## 2020-03-31 LAB — ANA,IFA RA DIAG PNL W/RFLX TIT/PATN
ANA Titer 1: NEGATIVE
Cyclic Citrullin Peptide Ab: 5 units (ref 0–19)
Rhuematoid fact SerPl-aCnc: 11.3 IU/mL (ref 0.0–13.9)

## 2020-03-31 LAB — SEDIMENTATION RATE: Sed Rate: 42 mm/hr — ABNORMAL HIGH (ref 0–40)

## 2020-04-28 ENCOUNTER — Other Ambulatory Visit: Payer: Self-pay

## 2020-04-28 ENCOUNTER — Ambulatory Visit (INDEPENDENT_AMBULATORY_CARE_PROVIDER_SITE_OTHER): Payer: 59 | Admitting: Family Medicine

## 2020-04-28 DIAGNOSIS — Z0000 Encounter for general adult medical examination without abnormal findings: Secondary | ICD-10-CM | POA: Diagnosis not present

## 2020-04-29 LAB — CMP14+EGFR
ALT: 14 IU/L (ref 0–32)
AST: 24 IU/L (ref 0–40)
Albumin/Globulin Ratio: 1.5 (ref 1.2–2.2)
Albumin: 4.9 g/dL (ref 3.8–4.9)
Alkaline Phosphatase: 104 IU/L (ref 48–121)
BUN/Creatinine Ratio: 16 (ref 9–23)
BUN: 12 mg/dL (ref 6–24)
Bilirubin Total: 0.4 mg/dL (ref 0.0–1.2)
CO2: 24 mmol/L (ref 20–29)
Calcium: 10.3 mg/dL — ABNORMAL HIGH (ref 8.7–10.2)
Chloride: 99 mmol/L (ref 96–106)
Creatinine, Ser: 0.74 mg/dL (ref 0.57–1.00)
GFR calc Af Amer: 103 mL/min/{1.73_m2} (ref >59)
GFR calc non Af Amer: 89 mL/min/{1.73_m2} (ref >59)
Globulin, Total: 3.2 g/dL (ref 1.5–4.5)
Glucose: 94 mg/dL (ref 65–99)
Potassium: 4.9 mmol/L (ref 3.5–5.2)
Sodium: 140 mmol/L (ref 134–144)
Total Protein: 8.1 g/dL (ref 6.0–8.5)

## 2020-04-29 LAB — LIPID PANEL
Chol/HDL Ratio: 5.6 ratio — ABNORMAL HIGH (ref 0.0–4.4)
Cholesterol, Total: 237 mg/dL — ABNORMAL HIGH (ref 100–199)
HDL: 42 mg/dL (ref >39)
LDL Chol Calc (NIH): 149 mg/dL — ABNORMAL HIGH (ref 0–99)
Triglycerides: 251 mg/dL — ABNORMAL HIGH (ref 0–149)
VLDL Cholesterol Cal: 46 mg/dL — ABNORMAL HIGH (ref 5–40)

## 2020-04-29 LAB — HEMOGLOBIN A1C
Est. average glucose Bld gHb Est-mCnc: 114 mg/dL
Hgb A1c MFr Bld: 5.6 % (ref 4.8–5.6)

## 2020-05-04 ENCOUNTER — Encounter: Payer: Self-pay | Admitting: Emergency Medicine

## 2020-05-04 ENCOUNTER — Ambulatory Visit (INDEPENDENT_AMBULATORY_CARE_PROVIDER_SITE_OTHER): Payer: 59 | Admitting: Emergency Medicine

## 2020-05-04 ENCOUNTER — Other Ambulatory Visit: Payer: Self-pay

## 2020-05-04 VITALS — BP 147/82 | HR 84 | Temp 98.1°F | Ht 61.0 in | Wt 147.0 lb

## 2020-05-04 DIAGNOSIS — N939 Abnormal uterine and vaginal bleeding, unspecified: Secondary | ICD-10-CM | POA: Insufficient documentation

## 2020-05-04 DIAGNOSIS — E785 Hyperlipidemia, unspecified: Secondary | ICD-10-CM

## 2020-05-04 DIAGNOSIS — Z0001 Encounter for general adult medical examination with abnormal findings: Secondary | ICD-10-CM | POA: Diagnosis not present

## 2020-05-04 DIAGNOSIS — Z1231 Encounter for screening mammogram for malignant neoplasm of breast: Secondary | ICD-10-CM | POA: Diagnosis not present

## 2020-05-04 MED ORDER — ROSUVASTATIN CALCIUM 10 MG PO TABS: 10.0000 mg | ORAL_TABLET | Freq: Every day | ORAL | 3 refills | Status: DC

## 2020-05-04 NOTE — Patient Instructions (Addendum)
   If you have lab work done today you will be contacted with your lab results within the next 2 weeks.  If you have not heard from us then please contact us. The fastest way to get your results is to register for My Chart.   IF you received an x-ray today, you will receive an invoice from [facility]. Please contact  Radiology at [phone] with questions or concerns regarding your invoice.   IF you received labwork today, you will receive an invoice from [facility]. Please contact [facility] at [phone] with questions or concerns regarding your invoice.   Our billing staff will not be able to assist you with questions regarding bills from these companies.  You will be contacted with the lab results as soon as they are available. The fastest way to get your results is to activate your My Chart account. Instructions are located on the last page of this paperwork. If you have not heard from us regarding the results in 2 weeks, please contact this office.       Health Maintenance, Female Adopting a healthy lifestyle and getting preventive care are important in promoting health and wellness. Ask your health care provider about:  The right schedule for you to have regular tests and exams.  Things you can do on your own to prevent diseases and keep yourself healthy. What should I know about diet, weight, and exercise? Eat a healthy diet   Eat a diet that includes plenty of vegetables, fruits, low-fat dairy products, and lean protein.  Do not eat a lot of foods that are high in solid fats, added sugars, or sodium. Maintain a healthy weight Body mass index (BMI) is used to identify weight problems. It estimates body fat based on height and weight. Your health care provider can help determine your BMI and help you achieve or maintain a healthy weight. Get regular exercise Get regular exercise. This is one of the most important things you can do for your health. Most  adults should:  Exercise for at least 150 minutes each week. The exercise should increase your heart rate and make you sweat (moderate-intensity exercise).  Do strengthening exercises at least twice a week. This is in addition to the moderate-intensity exercise.  Spend less time sitting. Even light physical activity can be beneficial. Watch cholesterol and blood lipids Have your blood tested for lipids and cholesterol at 59 years of age, then have this test every 5 years. Have your cholesterol levels checked more often if:  Your lipid or cholesterol levels are high.  You are older than 59 years of age.  You are at high risk for heart disease. What should I know about cancer screening? Depending on your health history and family history, you may need to have cancer screening at various ages. This may include screening for:  Breast cancer.  Cervical cancer.  Colorectal cancer.  Skin cancer.  Lung cancer. What should I know about heart disease, diabetes, and high blood pressure? Blood pressure and heart disease  High blood pressure causes heart disease and increases the risk of stroke. This is more likely to develop in people who have high blood pressure readings, are of African descent, or are overweight.  Have your blood pressure checked: ? Every 3-5 years if you are 18-39 years of age. ? Every year if you are 40 years old or older. Diabetes Have regular diabetes screenings. This checks your fasting blood sugar level. Have the screening done:  Once   every three years after age 40 if you are at a normal weight and have a low risk for diabetes.  More often and at a younger age if you are overweight or have a high risk for diabetes. What should I know about preventing infection? Hepatitis B If you have a higher risk for hepatitis B, you should be screened for this virus. Talk with your health care provider to find out if you are at risk for hepatitis B infection. Hepatitis  C Testing is recommended for:  Everyone born from 1945 through 1965.  Anyone with known risk factors for hepatitis C. Sexually transmitted infections (STIs)  Get screened for STIs, including gonorrhea and chlamydia, if: ? You are sexually active and are younger than 59 years of age. ? You are older than 59 years of age and your health care provider tells you that you are at risk for this type of infection. ? Your sexual activity has changed since you were last screened, and you are at increased risk for chlamydia or gonorrhea. Ask your health care provider if you are at risk.  Ask your health care provider about whether you are at high risk for HIV. Your health care provider may recommend a prescription medicine to help prevent HIV infection. If you choose to take medicine to prevent HIV, you should first get tested for HIV. You should then be tested every 3 months for as long as you are taking the medicine. Pregnancy  If you are about to stop having your period (premenopausal) and you may become pregnant, seek counseling before you get pregnant.  Take 400 to 800 micrograms (mcg) of folic acid every day if you become pregnant.  Ask for birth control (contraception) if you want to prevent pregnancy. Osteoporosis and menopause Osteoporosis is a disease in which the bones lose minerals and strength with aging. This can result in bone fractures. If you are 65 years old or older, or if you are at risk for osteoporosis and fractures, ask your health care provider if you should:  Be screened for bone loss.  Take a calcium or vitamin D supplement to lower your risk of fractures.  Be given hormone replacement therapy (HRT) to treat symptoms of menopause. Follow these instructions at home: Lifestyle  Do not use any products that contain nicotine or tobacco, such as cigarettes, e-cigarettes, and chewing tobacco. If you need help quitting, ask your health care provider.  Do not use street  drugs.  Do not share needles.  Ask your health care provider for help if you need support or information about quitting drugs. Alcohol use  Do not drink alcohol if: ? Your health care provider tells you not to drink. ? You are pregnant, may be pregnant, or are planning to become pregnant.  If you drink alcohol: ? Limit how much you use to 0-1 drink a day. ? Limit intake if you are breastfeeding.  Be aware of how much alcohol is in your drink. In the [place] one drink equals one 12 oz bottle of beer (355 mL), one 5 oz glass of wine (148 mL), or one 1 oz glass of hard liquor (44 mL). General instructions  Schedule regular health, dental, and eye exams.  Stay current with your vaccines.  Tell your health care provider if: ? You often feel depressed. ? You have ever been abused or do not feel safe at home. Summary  Adopting a healthy lifestyle and getting preventive care are important in promoting health and   wellness.  Follow your health care provider's instructions about healthy diet, exercising, and getting tested or screened for diseases.  Follow your health care provider's instructions on monitoring your cholesterol and blood pressure. This information is not intended to replace advice given to you by your health care provider. Make sure you discuss any questions you have with your health care provider. Document Revised: 11/11/2018 Document Reviewed: 11/11/2018 Elsevier Patient Education  2020 [employer]  

## 2020-05-04 NOTE — Progress Notes (Signed)
The 10-year ASCVD risk score Mikey Bussing DC Brooke Bonito., et al., 2013) is: 5.4%   Values used to calculate the score:     Age: 59 years     Sex: Female     Is Non-Hispanic African American: No     Diabetic: No     Tobacco smoker: No     Systolic Blood Pressure: [id number] mmHg     Is BP treated: No     HDL Cholesterol: 42 mg/dL     Total Cholesterol: 237 mg/dL Vanessa Nixon 59 y.o.   Chief Complaint  Patient presents with  . Annual Exam  . lab results    HISTORY OF PRESENT ILLNESS: This is a 59 y.o. female here for annual exam. Recent blood results show dyslipidemia. Has abnormal vaginal bleeding. Needs referral for screening mammogram. Up-to-date with colonoscopy. No other complaints or medical concerns today.  HPI   Prior to Admission medications   Medication Sig Start Date End Date Taking? Authorizing Provider  IRON PO Take by mouth.   Yes [provider]  Multiple Vitamins-Minerals (MULTIVITAMIN WITH MINERALS) tablet Take 1 tablet by mouth daily.   Yes [provider]    Allergies  Allergen Reactions  . Codeine Itching    There are no problems to display for this patient.   Past Medical History:  Diagnosis Date  . Allergy   . Anemia   . Arthritis     Past Surgical History:  Procedure Laterality Date  . CESAREAN SECTION    . COLONOSCOPY     30 plus years ago  . WISDOM TOOTH EXTRACTION      Social History   Socioeconomic History  . Marital status: Married    Spouse name: Not on file  . Number of children: 5  . Years of education: Not on file  . Highest education level: Bachelor's degree (e.g., BA, AB, BS)  Occupational History  . Not on file  Tobacco Use  . Smoking status: Never Smoker  . Smokeless tobacco: Never Used  Substance and Sexual Activity  . Alcohol use: Never    Alcohol/week: 0.0 standard drinks  . Drug use: No  . Sexual activity: Yes    Partners: Male    Birth control/protection: Condom    Comment: with monogamous  husband-1st intercourse 19 yo-Fewer than 5 partners  Other Topics Concern  . Not on file  Social History Narrative   Pt is from [place]. Works at [employer] with [employer]. Lives at home with husband and 4 of 5 children.    Social Determinants of Health   Financial Resource Strain:   . Difficulty of Paying Living Expenses:   Food Insecurity:   . Worried About Charity fundraiser in the Last Year:   . Arboriculturist in the Last Year:   Transportation Needs:   . Film/video editor (Medical):   Marland Kitchen Lack of Transportation (Non-Medical):   Physical Activity:   . Days of Exercise per Week:   . Minutes of Exercise per Session:   Stress:   . Feeling of Stress :   Social Connections:   . Frequency of Communication with Friends and Family:   . Frequency of Social Gatherings with Friends and Family:   . Attends Religious Services:   . Active Member of Clubs or Organizations:   . Attends Archivist Meetings:   Marland Kitchen Marital Status:   Intimate Partner Violence:   . Fear of Current or Ex-Partner:   .  Emotionally Abused:   Marland Kitchen Physically Abused:   . Sexually Abused:     Family History  Problem Relation Age of Onset  . COPD Mother   . Arthritis Mother   . Breast cancer Mother 54  . Alzheimer's disease Father   . Cancer Father        Prostate,Multiple myloma  . Thyroid disease Father   . Cancer Sister        Liver  . Breast cancer Sister 33  . Colon cancer Paternal Aunt   . Colon polyps Neg Hx   . Esophageal cancer Neg Hx   . Rectal cancer Neg Hx   . Stomach cancer Neg Hx      Review of Systems  Constitutional: Negative.  Negative for chills and fever.  HENT: Negative.  Negative for congestion and sore throat.   Eyes: Negative.   Respiratory: Negative.  Negative for cough and shortness of breath.   Cardiovascular: Negative.  Negative for chest pain and palpitations.  Gastrointestinal: Negative.  Negative for abdominal pain, blood in stool, diarrhea, melena,  nausea and vomiting.  Genitourinary:       Abnormal vaginal bleeding episodes  Musculoskeletal: Negative.  Negative for back pain, myalgias and neck pain.  Skin: Negative.  Negative for rash.  Neurological: Negative.  Negative for dizziness and headaches.  Endo/Heme/Allergies: Negative.   All other systems reviewed and are negative.  Today's Vitals   05/04/20 1536  BP: (!) 147/82  Pulse: 84  Temp: 98.1 F (36.7 C)  SpO2: 96%  Weight: 147 lb (66.7 kg)  Height: 5\' 1"  (1.549 m)   Body mass index is 27.78 kg/m.   Physical Exam Vitals reviewed.  Constitutional:      Appearance: Normal appearance.  HENT:     Head: Normocephalic.     Mouth/Throat:     Mouth: Mucous membranes are moist.     Pharynx: Oropharynx is clear.  Eyes:     Extraocular Movements: Extraocular movements intact.     Conjunctiva/sclera: Conjunctivae normal.     Pupils: Pupils are equal, round, and reactive to light.  Cardiovascular:     Rate and Rhythm: Normal rate and regular rhythm.     Pulses: Normal pulses.     Heart sounds: Normal heart sounds.  Pulmonary:     Effort: Pulmonary effort is normal.     Breath sounds: Normal breath sounds.  Abdominal:     General: Bowel sounds are normal. There is no distension.     Palpations: Abdomen is soft. There is no mass.     Tenderness: There is no abdominal tenderness.  Musculoskeletal:        General: Normal range of motion.     Cervical back: Normal range of motion and neck supple.  Skin:    General: Skin is warm and dry.     Capillary Refill: Capillary refill takes less than 2 seconds.  Neurological:     General: No focal deficit present.     Mental Status: She is alert and oriented to person, place, and time.  Psychiatric:        Mood and Affect: Mood normal.        Behavior: Behavior normal.      ASSESSMENT & PLAN: Vanessa Nixon was seen today for annual exam and lab results.  Diagnoses and all orders for this visit:  Encounter for general adult  medical examination with abnormal findings  Encounter for screening mammogram for malignant neoplasm of breast -  MM DIGITAL SCREENING BILATERAL; Future  Abnormal vaginal bleeding -     Ambulatory referral to Gynecology  Dyslipidemia -     rosuvastatin (CRESTOR) 10 MG tablet; Take 1 tablet (10 mg total) by mouth daily.    Patient Instructions       If you have lab work done today you will be contacted with your lab results within the next 2 weeks.  If you have not heard from [place] then please contact us. The fastest way to get your results is to register for My Chart.   IF you received an x-ray today, you will receive an invoice from [facility]. Please contact [facility] at [phone] with questions or concerns regarding your invoice.   IF you received labwork today, you will receive an invoice from [facility]. Please contact [facility] at [phone] with questions or concerns regarding your invoice.   Our billing staff will not be able to assist you with questions regarding bills from these companies.  You will be contacted with the lab results as soon as they are available. The fastest way to get your results is to activate your My Chart account. Instructions are located on the last page of this paperwork. If you have not heard from [place] regarding the results in 2 weeks, please contact this office.      Health Maintenance, Female Adopting a healthy lifestyle and getting preventive care are important in promoting health and wellness. Ask your health care provider about:  The right schedule for you to have regular tests and exams.  Things you can do on your own to prevent diseases and keep yourself healthy. What should I know about diet, weight, and exercise? Eat a healthy diet   Eat a diet that includes plenty of vegetables, fruits, low-fat dairy products, and lean protein.  Do not eat a lot of foods that are high in solid fats, added sugars, or  sodium. Maintain a healthy weight Body mass index (BMI) is used to identify weight problems. It estimates body fat based on height and weight. Your health care provider can help determine your BMI and help you achieve or maintain a healthy weight. Get regular exercise Get regular exercise. This is one of the most important things you can do for your health. Most adults should:  Exercise for at least 150 minutes each week. The exercise should increase your heart rate and make you sweat (moderate-intensity exercise).  Do strengthening exercises at least twice a week. This is in addition to the moderate-intensity exercise.  Spend less time sitting. Even light physical activity can be beneficial. Watch cholesterol and blood lipids Have your blood tested for lipids and cholesterol at 59 years of age, then have this test every 5 years. Have your cholesterol levels checked more often if:  Your lipid or cholesterol levels are high.  You are older than 59 years of age.  You are at high risk for heart disease. What should I know about cancer screening? Depending on your health history and family history, you may need to have cancer screening at various ages. This may include screening for:  Breast cancer.  Cervical cancer.  Colorectal cancer.  Skin cancer.  Lung cancer. What should I know about heart disease, diabetes, and high blood pressure? Blood pressure and heart disease  High blood pressure causes heart disease and increases the risk of stroke. This is more likely to develop in people who have high blood pressure readings, are of African descent, or are overweight.  Have your blood pressure checked: ? Every 3-5 years if you are 52-18 years of age. ? Every year if you are 74 years old or older. Diabetes Have regular diabetes screenings. This checks your fasting blood sugar level. Have the screening done:  Once every three years after age 69 if you are at a normal weight and have  a low risk for diabetes.  More often and at a younger age if you are overweight or have a high risk for diabetes. What should I know about preventing infection? Hepatitis B If you have a higher risk for hepatitis B, you should be screened for this virus. Talk with your health care provider to find out if you are at risk for hepatitis B infection. Hepatitis C Testing is recommended for:  Everyone born from [birth year] through 1965.  Anyone with known risk factors for hepatitis C. Sexually transmitted infections (STIs)  Get screened for STIs, including gonorrhea and chlamydia, if: ? You are sexually active and are younger than 59 years of age. ? You are older than 59 years of age and your health care provider tells you that you are at risk for this type of infection. ? Your sexual activity has changed since you were last screened, and you are at increased risk for chlamydia or gonorrhea. Ask your health care provider if you are at risk.  Ask your health care provider about whether you are at high risk for HIV. Your health care provider may recommend a prescription medicine to help prevent HIV infection. If you choose to take medicine to prevent HIV, you should first get tested for HIV. You should then be tested every 3 months for as long as you are taking the medicine. Pregnancy  If you are about to stop having your period (premenopausal) and you may become pregnant, seek counseling before you get pregnant.  Take 400 to 800 micrograms (mcg) of folic acid every day if you become pregnant.  Ask for birth control (contraception) if you want to prevent pregnancy. Osteoporosis and menopause Osteoporosis is a disease in which the bones lose minerals and strength with aging. This can result in bone fractures. If you are 33 years old or older, or if you are at risk for osteoporosis and fractures, ask your health care provider if you should:  Be screened for bone loss.  Take a calcium or vitamin D  supplement to lower your risk of fractures.  Be given hormone replacement therapy (HRT) to treat symptoms of menopause. Follow these instructions at home: Lifestyle  Do not use any products that contain nicotine or tobacco, such as cigarettes, e-cigarettes, and chewing tobacco. If you need help quitting, ask your health care provider.  Do not use street drugs.  Do not share needles.  Ask your health care provider for help if you need support or information about quitting drugs. Alcohol use  Do not drink alcohol if: ? Your health care provider tells you not to drink. ? You are pregnant, may be pregnant, or are planning to become pregnant.  If you drink alcohol: ? Limit how much you use to 0-1 drink a day. ? Limit intake if you are breastfeeding.  Be aware of how much alcohol is in your drink. In the [place] one drink equals one 12 oz bottle of beer (355 mL), one 5 oz glass of wine (148 mL), or one 1 oz glass of hard liquor (44 mL). General instructions  Schedule regular health, dental, and eye exams.  Stay  current with your vaccines.  Tell your health care provider if: ? You often feel depressed. ? You have ever been abused or do not feel safe at home. Summary  Adopting a healthy lifestyle and getting preventive care are important in promoting health and wellness.  Follow your health care provider's instructions about healthy diet, exercising, and getting tested or screened for diseases.  Follow your health care provider's instructions on monitoring your cholesterol and blood pressure. This information is not intended to replace advice given to you by your health care provider. Make sure you discuss any questions you have with your health care provider. Document Revised: 11/11/2018 Document Reviewed: 11/11/2018 Elsevier Patient Education  2020 Elsevier Inc.      Agustina Caroli, MD [facility]

## 2020-06-28 DIAGNOSIS — H52223 Regular astigmatism, bilateral: Secondary | ICD-10-CM | POA: Diagnosis not present

## 2020-06-28 DIAGNOSIS — H5213 Myopia, bilateral: Secondary | ICD-10-CM | POA: Diagnosis not present

## 2020-06-28 DIAGNOSIS — H524 Presbyopia: Secondary | ICD-10-CM | POA: Diagnosis not present

## 2020-08-05 DIAGNOSIS — H5213 Myopia, bilateral: Secondary | ICD-10-CM | POA: Diagnosis not present

## 2020-08-24 ENCOUNTER — Encounter: Payer: 59 | Admitting: Nurse Practitioner

## 2020-11-06 ENCOUNTER — Ambulatory Visit: Payer: 59 | Admitting: Emergency Medicine

## 2020-12-03 DIAGNOSIS — Z20828 Contact with and (suspected) exposure to other viral communicable diseases: Secondary | ICD-10-CM | POA: Diagnosis not present

## 2020-12-03 DIAGNOSIS — J029 Acute pharyngitis, unspecified: Secondary | ICD-10-CM | POA: Diagnosis not present

## 2020-12-03 DIAGNOSIS — R519 Headache, unspecified: Secondary | ICD-10-CM | POA: Diagnosis not present

## 2020-12-03 DIAGNOSIS — R051 Acute cough: Secondary | ICD-10-CM | POA: Diagnosis not present

## 2021-01-30 ENCOUNTER — Encounter: Payer: Self-pay | Admitting: Nurse Practitioner

## 2021-01-30 ENCOUNTER — Other Ambulatory Visit: Payer: Self-pay

## 2021-01-30 ENCOUNTER — Ambulatory Visit (INDEPENDENT_AMBULATORY_CARE_PROVIDER_SITE_OTHER): Payer: 59 | Admitting: Nurse Practitioner

## 2021-01-30 VITALS — BP 140/90 | HR 82 | Resp 12 | Ht 61.0 in | Wt 154.5 lb

## 2021-01-30 DIAGNOSIS — N816 Rectocele: Secondary | ICD-10-CM | POA: Diagnosis not present

## 2021-01-30 DIAGNOSIS — Z803 Family history of malignant neoplasm of breast: Secondary | ICD-10-CM | POA: Diagnosis not present

## 2021-01-30 DIAGNOSIS — Z01419 Encounter for gynecological examination (general) (routine) without abnormal findings: Secondary | ICD-10-CM

## 2021-01-30 NOTE — Patient Instructions (Signed)

## 2021-01-30 NOTE — Progress Notes (Signed)
   Vanessa Nixon Oct 24, 1961 [id number]   History:  60 y.o. [id number] presents for annual exam.Perimenopausal with last cycle 8 months ago. Mild menopausal symptoms. Normal pap and mammogram history. Mother diagnosed with breast cancer in her 85s, sister in her 52s. Mildly symptomatic rectocele.    Gynecologic History No LMP recorded (approximate). (Menstrual status: Perimenopausal).   Contraception/Family planning: none  Health Maintenance Last Pap: 05/18/2018. Results were: normal Last mammogram: 09/2018. Results were: normal, scheduled 02/17/2021 Last colonoscopy: 2019. Results were: polyp, 5-year recall Last Dexa: N/A  Past medical history, past surgical history, family history and social history were all reviewed and documented in the EPIC chart.  ROS:  A ROS was performed and pertinent positives and negatives are included.  Exam:  Vitals:   01/30/21 1613  BP: 140/90  Pulse: 82  Resp: 12  Weight: 154 lb 8 oz (70.1 kg)  Height: 5\' 1"  (1.549 m)   Body mass index is 29.19 kg/m.  General appearance:  Normal Thyroid:  Symmetrical, normal in size, without palpable masses or nodularity. Respiratory  Auscultation:  Clear without wheezing or rhonchi Cardiovascular  Auscultation:  Regular rate, without rubs, murmurs or gallops  Edema/varicosities:  Not grossly evident Abdominal  Soft,nontender, without masses, guarding or rebound.  Liver/spleen:  No organomegaly noted  Hernia:  None appreciated  Skin  Inspection:  Grossly normal   Breasts: Examined lying and sitting.   Right: Without masses, retractions, discharge or axillary adenopathy.   Left: Without masses, retractions, discharge or axillary adenopathy. Gentitourinary   Inguinal/mons:  Normal without inguinal adenopathy  External genitalia:  Normal  BUS/Urethra/Skene's glands:  Normal  Vagina:  Rectocele  Cervix:  Normal  Uterus:  Normal in size, shape and contour.  Midline and mobile  Adnexa/parametria:      Rt: Without masses or tenderness.   Lt: Without masses or tenderness.  Anus and perineum: Normal  Digital rectal exam: Normal sphincter tone without palpated masses or tenderness  Assessment/Plan:  60 y.o. [id number] for annual exam.   Well female exam with routine gynecological exam - Education provided on SBEs, importance of preventative screenings, current guidelines, high calcium diet, regular exercise, and multivitamin daily. Labs with PCP.  Elevated blood pressure reading - 140/90. Says she thinks caffeine causes her elevated blood pressure. We discussed lifestyle changes such as decreasing caffeine intake and weight loss for management. Also recommend checking routinely and following up with PCP.   Family history of breast cancer in first degree relative - mother diagnosed in her 70s, sister in her 80s.   Rectocele - mildly symptomatic. Discussed Pelvic floor strengthening exercises and if she begins to have vaginal pressure or urinary symptoms such as difficulty urinating we will discuss treatment options.   Screening for cervical cancer - Normal Pap history.  Will repeat at 5-year interval per guidelines.  Screening for breast cancer - Normal mammogram history.  Continue annual screenings.  Normal breast exam today. Mammogram scheduled for 02/17/2021.  Screening for colon cancer - 2019 colonoscopy. Will repeat at GI's recommended interval.   Follow up in 1 year for annual.   Tamela Gammon [facility], 4:29 PM 01/30/2021

## 2021-02-14 DIAGNOSIS — H6691 Otitis media, unspecified, right ear: Secondary | ICD-10-CM | POA: Diagnosis not present

## 2021-02-15 DIAGNOSIS — Z803 Family history of malignant neoplasm of breast: Secondary | ICD-10-CM | POA: Diagnosis not present

## 2021-02-15 DIAGNOSIS — Z1231 Encounter for screening mammogram for malignant neoplasm of breast: Secondary | ICD-10-CM | POA: Diagnosis not present

## 2021-02-15 LAB — HM MAMMOGRAPHY

## 2021-02-22 ENCOUNTER — Encounter: Payer: Self-pay | Admitting: *Deleted

## 2021-02-23 ENCOUNTER — Ambulatory Visit: Payer: Self-pay

## 2021-04-12 ENCOUNTER — Telehealth: Payer: Self-pay | Admitting: Emergency Medicine

## 2021-04-12 ENCOUNTER — Telehealth: Payer: Self-pay

## 2021-04-12 DIAGNOSIS — Z0000 Encounter for general adult medical examination without abnormal findings: Secondary | ICD-10-CM

## 2021-04-12 NOTE — Telephone Encounter (Signed)
Pt requesting labs to be draw before appointment on 05/07/21. Placed future an order for A1c, CMP, and lipid.

## 2021-04-12 NOTE — Telephone Encounter (Signed)
Placed future orders for pt to get blood drawn before appointment for physical. Called pt to notify, no answer. Left a VM to call back.

## 2021-04-12 NOTE — Telephone Encounter (Signed)
Thanks

## 2021-04-12 NOTE — Telephone Encounter (Signed)
Patient wondering if she can get lab work done before her appointment on 06.06.22. 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 Mitchel Honour would like her to do the labs.

## 2021-04-24 ENCOUNTER — Other Ambulatory Visit: Payer: Self-pay

## 2021-04-24 DIAGNOSIS — Z0000 Encounter for general adult medical examination without abnormal findings: Secondary | ICD-10-CM

## 2021-05-07 ENCOUNTER — Other Ambulatory Visit: Payer: Self-pay

## 2021-05-07 ENCOUNTER — Ambulatory Visit (INDEPENDENT_AMBULATORY_CARE_PROVIDER_SITE_OTHER): Payer: 59 | Admitting: Emergency Medicine

## 2021-05-07 ENCOUNTER — Encounter: Payer: Self-pay | Admitting: Emergency Medicine

## 2021-05-07 VITALS — BP 130/70 | HR 77 | Temp 97.7°F | Ht 61.0 in | Wt 154.8 lb

## 2021-05-07 DIAGNOSIS — Z13228 Encounter for screening for other metabolic disorders: Secondary | ICD-10-CM | POA: Diagnosis not present

## 2021-05-07 DIAGNOSIS — Z13220 Encounter for screening for lipoid disorders: Secondary | ICD-10-CM

## 2021-05-07 DIAGNOSIS — L989 Disorder of the skin and subcutaneous tissue, unspecified: Secondary | ICD-10-CM

## 2021-05-07 DIAGNOSIS — Z1329 Encounter for screening for other suspected endocrine disorder: Secondary | ICD-10-CM

## 2021-05-07 DIAGNOSIS — Z130 Encounter for screening for diseases of the blood and blood-forming organs and certain disorders involving the immune mechanism: Secondary | ICD-10-CM | POA: Diagnosis not present

## 2021-05-07 DIAGNOSIS — Z0000 Encounter for general adult medical examination without abnormal findings: Secondary | ICD-10-CM | POA: Diagnosis not present

## 2021-05-07 NOTE — Progress Notes (Signed)
Vanessa Nixon 60 y.o.   Chief Complaint  Patient presents with  . Annual Exam    HISTORY OF PRESENT ILLNESS: This is a 60 y.o. female here for her annual exam. Doing well.  Has no complaints or medical concerns.  HPI   Prior to Admission medications   Medication Sig Start Date End Date Taking? Authorizing Provider  IRON PO Take by mouth.   Yes [provider]  Multiple Vitamins-Minerals (MULTIVITAMIN WITH MINERALS) tablet Take 1 tablet by mouth daily.   Yes [provider]    Allergies  Allergen Reactions  . Codeine Itching    Patient Active Problem List   Diagnosis Date Noted  . Dyslipidemia 05/04/2020  . Abnormal vaginal bleeding 05/04/2020    Past Medical History:  Diagnosis Date  . Allergy   . Anemia   . Arthritis     Past Surgical History:  Procedure Laterality Date  . CESAREAN SECTION    . COLONOSCOPY     30 plus years ago  . WISDOM TOOTH EXTRACTION      Social History   Socioeconomic History  . Marital status: Married    Spouse name: Not on file  . Number of children: 5  . Years of education: Not on file  . Highest education level: Bachelor's degree (e.g., BA, AB, BS)  Occupational History  . Not on file  Tobacco Use  . Smoking status: Never Smoker  . Smokeless tobacco: Never Used  Vaping Use  . Vaping Use: Never used  Substance and Sexual Activity  . Alcohol use: Never    Alcohol/week: 0.0 standard drinks  . Drug use: No  . Sexual activity: Yes    Partners: Male    Birth control/protection: Condom    Comment: with monogamous husband-1st intercourse 19 yo-Fewer than 5 partners  Other Topics Concern  . Not on file  Social History Narrative   Pt is from [place]. Works at [employer] with [employer]. Lives at home with husband and 4 of 5 children.    Social Determinants of Health   Financial Resource Strain: Not on file  Food Insecurity: Not on file  Transportation Needs: Not on file  Physical Activity: Not on  file  Stress: Not on file  Social Connections: Not on file  Intimate Partner Violence: Not on file    Family History  Problem Relation Age of Onset  . COPD Mother   . Arthritis Mother   . Breast cancer Mother 8  . Alzheimer's disease Father   . Cancer Father        Prostate,Multiple myloma  . Thyroid disease Father   . Cancer Sister        Liver  . Breast cancer Sister 59  . Colon cancer Paternal Aunt   . Colon polyps Neg Hx   . Esophageal cancer Neg Hx   . Rectal cancer Neg Hx   . Stomach cancer Neg Hx      Review of Systems  Constitutional: Negative.  Negative for chills and fever.  HENT: Negative.  Negative for congestion and sore throat.   Respiratory: Negative.  Negative for cough and shortness of breath.   Cardiovascular: Negative.  Negative for chest pain and palpitations.  Gastrointestinal: Negative.  Negative for abdominal pain, diarrhea, nausea and vomiting.  Genitourinary: Negative.  Negative for dysuria and hematuria.  Skin: Negative.  Negative for rash.  Neurological: Negative.  Negative for dizziness and headaches.  All other systems reviewed and are negative.  Physical Exam Vitals reviewed.  Constitutional:      Appearance: Normal appearance.  HENT:     Head: Normocephalic.     Right Ear: Tympanic membrane, ear canal and external ear normal.     Left Ear: Tympanic membrane, ear canal and external ear normal.  Eyes:     Extraocular Movements: Extraocular movements intact.     Conjunctiva/sclera: Conjunctivae normal.     Pupils: Pupils are equal, round, and reactive to light.  Neck:     Vascular: No carotid bruit.  Cardiovascular:     Rate and Rhythm: Normal rate and regular rhythm.     Pulses: Normal pulses.     Heart sounds: Normal heart sounds.  Pulmonary:     Effort: Pulmonary effort is normal.     Breath sounds: Normal breath sounds.  Abdominal:     General: Bowel sounds are normal. There is no distension.     Palpations: Abdomen is  soft.     Tenderness: There is no abdominal tenderness.  Musculoskeletal:        General: Normal range of motion.     Cervical back: Normal range of motion and neck supple. No tenderness.     Right lower leg: No edema.     Left lower leg: No edema.  Lymphadenopathy:     Cervical: No cervical adenopathy.  Skin:    General: Skin is warm and dry.     Capillary Refill: Capillary refill takes less than 2 seconds.  Neurological:     General: No focal deficit present.     Mental Status: She is alert and oriented to person, place, and time.  Psychiatric:        Mood and Affect: Mood normal.        Behavior: Behavior normal.      ASSESSMENT & PLAN: [name] was seen today for annual exam.  Diagnoses and all orders for this visit:  Routine general medical examination at a health care facility  Screening for deficiency anemia -     CBC with Differential/Platelet  Screening for lipoid disorders -     Lipid panel  Screening for endocrine, metabolic and immunity disorder -     Comprehensive metabolic panel -     Hemoglobin A1c -     TSH  Skin lesions -     Ambulatory referral to Dermatology   Modifiable risk factors discussed with patient. Anticipatory guidance according to age provided. The following topics were discussed: Smoking Diet and nutrition Benefits of exercise Cancer screening and colonoscopy and or mammogram review Vaccinations Cardiovascular risk assessment Mental health including depression and anxiety Fall and accident prevention   Patient Instructions     Health Maintenance, Female Adopting a healthy lifestyle and getting preventive care are important in promoting health and wellness. Ask your health care provider about:  The right schedule for you to have regular tests and exams.  Things you can do on your own to prevent diseases and keep yourself healthy. What should I know about diet, weight, and exercise? Eat a healthy diet  Eat a diet that  includes plenty of vegetables, fruits, low-fat dairy products, and lean protein.  Do not eat a lot of foods that are high in solid fats, added sugars, or sodium.   Maintain a healthy weight Body mass index (BMI) is used to identify weight problems. It estimates body fat based on height and weight. Your health care provider can help determine your BMI and help you achieve or  maintain a healthy weight. Get regular exercise Get regular exercise. This is one of the most important things you can do for your health. Most adults should:  Exercise for at least 150 minutes each week. The exercise should increase your heart rate and make you sweat (moderate-intensity exercise).  Do strengthening exercises at least twice a week. This is in addition to the moderate-intensity exercise.  Spend less time sitting. Even light physical activity can be beneficial. Watch cholesterol and blood lipids Have your blood tested for lipids and cholesterol at 60 years of age, then have this test every 5 years. Have your cholesterol levels checked more often if:  Your lipid or cholesterol levels are high.  You are older than 60 years of age.  You are at high risk for heart disease. What should I know about cancer screening? Depending on your health history and family history, you may need to have cancer screening at various ages. This may include screening for:  Breast cancer.  Cervical cancer.  Colorectal cancer.  Skin cancer.  Lung cancer. What should I know about heart disease, diabetes, and high blood pressure? Blood pressure and heart disease  High blood pressure causes heart disease and increases the risk of stroke. This is more likely to develop in people who have high blood pressure readings, are of African descent, or are overweight.  Have your blood pressure checked: ? Every 3-5 years if you are 73-45 years of age. ? Every year if you are 12 years old or older. Diabetes Have regular diabetes  screenings. This checks your fasting blood sugar level. Have the screening done:  Once every three years after age 18 if you are at a normal weight and have a low risk for diabetes.  More often and at a younger age if you are overweight or have a high risk for diabetes. What should I know about preventing infection? Hepatitis B If you have a higher risk for hepatitis B, you should be screened for this virus. Talk with your health care provider to find out if you are at risk for hepatitis B infection. Hepatitis C Testing is recommended for:  Everyone born from [birth year] through 1965.  Anyone with known risk factors for hepatitis C. Sexually transmitted infections (STIs)  Get screened for STIs, including gonorrhea and chlamydia, if: ? You are sexually active and are younger than 60 years of age. ? You are older than 60 years of age and your health care provider tells you that you are at risk for this type of infection. ? Your sexual activity has changed since you were last screened, and you are at increased risk for chlamydia or gonorrhea. Ask your health care provider if you are at risk.  Ask your health care provider about whether you are at high risk for HIV. Your health care provider may recommend a prescription medicine to help prevent HIV infection. If you choose to take medicine to prevent HIV, you should first get tested for HIV. You should then be tested every 3 months for as long as you are taking the medicine. Pregnancy  If you are about to stop having your period (premenopausal) and you may become pregnant, seek counseling before you get pregnant.  Take 400 to 800 micrograms (mcg) of folic acid every day if you become pregnant.  Ask for birth control (contraception) if you want to prevent pregnancy. Osteoporosis and menopause Osteoporosis is a disease in which the bones lose minerals and strength with aging. This can  result in bone fractures. If you are 23 years old or older, or if  you are at risk for osteoporosis and fractures, ask your health care provider if you should:  Be screened for bone loss.  Take a calcium or vitamin D supplement to lower your risk of fractures.  Be given hormone replacement therapy (HRT) to treat symptoms of menopause. Follow these instructions at home: Lifestyle  Do not use any products that contain nicotine or tobacco, such as cigarettes, e-cigarettes, and chewing tobacco. If you need help quitting, ask your health care provider.  Do not use street drugs.  Do not share needles.  Ask your health care provider for help if you need support or information about quitting drugs. Alcohol use  Do not drink alcohol if: ? Your health care provider tells you not to drink. ? You are pregnant, may be pregnant, or are planning to become pregnant.  If you drink alcohol: ? Limit how much you use to 0-1 drink a day. ? Limit intake if you are breastfeeding.  Be aware of how much alcohol is in your drink. In the [place] one drink equals one 12 oz bottle of beer (355 mL), one 5 oz glass of wine (148 mL), or one 1 oz glass of hard liquor (44 mL). General instructions  Schedule regular health, dental, and eye exams.  Stay current with your vaccines.  Tell your health care provider if: ? You often feel depressed. ? You have ever been abused or do not feel safe at home. Summary  Adopting a healthy lifestyle and getting preventive care are important in promoting health and wellness.  Follow your health care provider's instructions about healthy diet, exercising, and getting tested or screened for diseases.  Follow your health care provider's instructions on monitoring your cholesterol and blood pressure. This information is not intended to replace advice given to you by your health care provider. Make sure you discuss any questions you have with your health care provider. Document Revised: 11/11/2018 Document Reviewed: 11/11/2018 Elsevier  Patient Education  2021 [place], MD [facility]

## 2021-05-07 NOTE — Patient Instructions (Signed)
Health Maintenance, Female Adopting a healthy lifestyle and getting preventive care are important in promoting health and wellness. Ask your health care provider about:  The right schedule for you to have regular tests and exams.  Things you can do on your own to prevent diseases and keep yourself healthy. What should I know about diet, weight, and exercise? Eat a healthy diet  Eat a diet that includes plenty of vegetables, fruits, low-fat dairy products, and lean protein.  Do not eat a lot of foods that are high in solid fats, added sugars, or sodium.   Maintain a healthy weight Body mass index (BMI) is used to identify weight problems. It estimates body fat based on height and weight. Your health care provider can help determine your BMI and help you achieve or maintain a healthy weight. Get regular exercise Get regular exercise. This is one of the most important things you can do for your health. Most adults should:  Exercise for at least 150 minutes each week. The exercise should increase your heart rate and make you sweat (moderate-intensity exercise).  Do strengthening exercises at least twice a week. This is in addition to the moderate-intensity exercise.  Spend less time sitting. Even light physical activity can be beneficial. Watch cholesterol and blood lipids Have your blood tested for lipids and cholesterol at 60 years of age, then have this test every 5 years. Have your cholesterol levels checked more often if:  Your lipid or cholesterol levels are high.  You are older than 60 years of age.  You are at high risk for heart disease. What should I know about cancer screening? Depending on your health history and family history, you may need to have cancer screening at various ages. This may include screening for:  Breast cancer.  Cervical cancer.  Colorectal cancer.  Skin cancer.  Lung cancer. What should I know about heart disease, diabetes, and high blood  pressure? Blood pressure and heart disease  High blood pressure causes heart disease and increases the risk of stroke. This is more likely to develop in people who have high blood pressure readings, are of African descent, or are overweight.  Have your blood pressure checked: ? Every 3-5 years if you are 18-39 years of age. ? Every year if you are 40 years old or older. Diabetes Have regular diabetes screenings. This checks your fasting blood sugar level. Have the screening done:  Once every three years after age 40 if you are at a normal weight and have a low risk for diabetes.  More often and at a younger age if you are overweight or have a high risk for diabetes. What should I know about preventing infection? Hepatitis B If you have a higher risk for hepatitis B, you should be screened for this virus. Talk with your health care provider to find out if you are at risk for hepatitis B infection. Hepatitis C Testing is recommended for:  Everyone born from 1945 through 1965.  Anyone with known risk factors for hepatitis C. Sexually transmitted infections (STIs)  Get screened for STIs, including gonorrhea and chlamydia, if: ? You are sexually active and are younger than 60 years of age. ? You are older than 60 years of age and your health care provider tells you that you are at risk for this type of infection. ? Your sexual activity has changed since you were last screened, and you are at increased risk for chlamydia or gonorrhea. Ask your health care provider   if you are at risk.  Ask your health care provider about whether you are at high risk for HIV. Your health care provider may recommend a prescription medicine to help prevent HIV infection. If you choose to take medicine to prevent HIV, you should first get tested for HIV. You should then be tested every 3 months for as long as you are taking the medicine. Pregnancy  If you are about to stop having your period (premenopausal) and  you may become pregnant, seek counseling before you get pregnant.  Take 400 to 800 micrograms (mcg) of folic acid every day if you become pregnant.  Ask for birth control (contraception) if you want to prevent pregnancy. Osteoporosis and menopause Osteoporosis is a disease in which the bones lose minerals and strength with aging. This can result in bone fractures. If you are 65 years old or older, or if you are at risk for osteoporosis and fractures, ask your health care provider if you should:  Be screened for bone loss.  Take a calcium or vitamin D supplement to lower your risk of fractures.  Be given hormone replacement therapy (HRT) to treat symptoms of menopause. Follow these instructions at home: Lifestyle  Do not use any products that contain nicotine or tobacco, such as cigarettes, e-cigarettes, and chewing tobacco. If you need help quitting, ask your health care provider.  Do not use street drugs.  Do not share needles.  Ask your health care provider for help if you need support or information about quitting drugs. Alcohol use  Do not drink alcohol if: ? Your health care provider tells you not to drink. ? You are pregnant, may be pregnant, or are planning to become pregnant.  If you drink alcohol: ? Limit how much you use to 0-1 drink a day. ? Limit intake if you are breastfeeding.  Be aware of how much alcohol is in your drink. In the [place] one drink equals one 12 oz bottle of beer (355 mL), one 5 oz glass of wine (148 mL), or one 1 oz glass of hard liquor (44 mL). General instructions  Schedule regular health, dental, and eye exams.  Stay current with your vaccines.  Tell your health care provider if: ? You often feel depressed. ? You have ever been abused or do not feel safe at home. Summary  Adopting a healthy lifestyle and getting preventive care are important in promoting health and wellness.  Follow your health care provider's instructions about healthy  diet, exercising, and getting tested or screened for diseases.  Follow your health care provider's instructions on monitoring your cholesterol and blood pressure. This information is not intended to replace advice given to you by your health care provider. Make sure you discuss any questions you have with your health care provider. Document Revised: 11/11/2018 Document Reviewed: 11/11/2018 Elsevier Patient Education  2021 Elsevier Inc.  

## 2021-05-08 LAB — COMPREHENSIVE METABOLIC PANEL
ALT: 15 U/L (ref 0–35)
AST: 18 U/L (ref 0–37)
Albumin: 4.1 g/dL (ref 3.5–5.2)
Alkaline Phosphatase: 92 U/L (ref 39–117)
BUN: 14 mg/dL (ref 6–23)
CO2: 24 mEq/L (ref 19–32)
Calcium: 9.5 mg/dL (ref 8.4–10.5)
Chloride: 100 mEq/L (ref 96–112)
Creatinine, Ser: 0.67 mg/dL (ref 0.40–1.20)
GFR: 95.1 mL/min (ref >60.00)
Glucose, Bld: 77 mg/dL (ref 70–99)
Potassium: 4 mEq/L (ref 3.5–5.1)
Sodium: 137 mEq/L (ref 135–145)
Total Bilirubin: 0.8 mg/dL (ref 0.2–1.2)
Total Protein: 7.6 g/dL (ref 6.0–8.3)

## 2021-05-08 LAB — CBC WITH DIFFERENTIAL/PLATELET
Basophils Absolute: 0.1 10*3/uL (ref 0.0–0.1)
Basophils Relative: 0.7 % (ref 0.0–3.0)
Eosinophils Absolute: 0.2 10*3/uL (ref 0.0–0.7)
Eosinophils Relative: 2.9 % (ref 0.0–5.0)
HCT: 37.4 % (ref 36.0–46.0)
Hemoglobin: 11.9 g/dL — ABNORMAL LOW (ref 12.0–15.0)
Lymphocytes Relative: 36.8 % (ref 12.0–46.0)
Lymphs Abs: 3.1 10*3/uL (ref 0.7–4.0)
MCHC: 31.8 g/dL (ref 30.0–36.0)
MCV: 68.4 fl — ABNORMAL LOW (ref 78.0–100.0)
Monocytes Absolute: 0.5 10*3/uL (ref 0.1–1.0)
Monocytes Relative: 5.6 % (ref 3.0–12.0)
Neutro Abs: 4.5 10*3/uL (ref 1.4–7.7)
Neutrophils Relative %: 54 % (ref 43.0–77.0)
Platelets: 224 10*3/uL (ref 150.0–400.0)
RBC: 5.48 Mil/uL — ABNORMAL HIGH (ref 3.87–5.11)
RDW: 15 % (ref 11.5–15.5)
WBC: 8.4 10*3/uL (ref 4.0–10.5)

## 2021-05-08 LAB — TSH: TSH: 2.41 u[IU]/mL (ref 0.35–4.50)

## 2021-05-08 LAB — HEMOGLOBIN A1C: Hgb A1c MFr Bld: 5.5 % (ref 4.6–6.5)

## 2021-05-08 LAB — LIPID PANEL
Cholesterol: 218 mg/dL — ABNORMAL HIGH (ref 0–200)
HDL: 35.7 mg/dL — ABNORMAL LOW (ref >39.00)
NonHDL: 181.9
Total CHOL/HDL Ratio: 6
Triglycerides: 291 mg/dL — ABNORMAL HIGH (ref 0.0–149.0)
VLDL: 58.2 mg/dL — ABNORMAL HIGH (ref 0.0–40.0)

## 2021-05-08 LAB — LDL CHOLESTEROL, DIRECT: Direct LDL: 121 mg/dL

## 2021-05-09 ENCOUNTER — Other Ambulatory Visit: Payer: Self-pay | Admitting: Emergency Medicine

## 2021-05-09 DIAGNOSIS — E785 Hyperlipidemia, unspecified: Secondary | ICD-10-CM

## 2021-05-09 MED ORDER — ROSUVASTATIN CALCIUM 10 MG PO TABS: 10.0000 mg | ORAL_TABLET | Freq: Every day | ORAL | 3 refills | Status: DC

## 2021-11-14 ENCOUNTER — Ambulatory Visit: Payer: 59 | Admitting: Physician Assistant

## 2021-11-14 ENCOUNTER — Other Ambulatory Visit: Payer: Self-pay

## 2021-11-14 ENCOUNTER — Encounter: Payer: Self-pay | Admitting: Physician Assistant

## 2021-11-14 DIAGNOSIS — L570 Actinic keratosis: Secondary | ICD-10-CM | POA: Diagnosis not present

## 2021-11-14 DIAGNOSIS — Z1283 Encounter for screening for malignant neoplasm of skin: Secondary | ICD-10-CM | POA: Diagnosis not present

## 2021-11-14 DIAGNOSIS — L2082 Flexural eczema: Secondary | ICD-10-CM

## 2021-11-14 DIAGNOSIS — D1722 Benign lipomatous neoplasm of skin and subcutaneous tissue of left arm: Secondary | ICD-10-CM

## 2021-11-14 DIAGNOSIS — L821 Other seborrheic keratosis: Secondary | ICD-10-CM

## 2021-11-14 DIAGNOSIS — D485 Neoplasm of uncertain behavior of skin: Secondary | ICD-10-CM

## 2021-11-14 DIAGNOSIS — D225 Melanocytic nevi of trunk: Secondary | ICD-10-CM

## 2021-11-14 MED ORDER — TRIAMCINOLONE ACETONIDE 0.1 % EX CREA
1.0000 | TOPICAL_CREAM | Freq: Every day | CUTANEOUS | 2 refills | Status: DC | PRN
Start: 2021-11-14 — End: 2023-06-09

## 2021-11-14 MED ORDER — PIMECROLIMUS 1 % EX CREA
TOPICAL_CREAM | Freq: Two times a day (BID) | CUTANEOUS | 1 refills | Status: DC
Start: 2021-11-14 — End: 2023-06-09

## 2021-11-14 NOTE — Progress Notes (Signed)
° °  New Patient   Subjective  Vanessa Nixon is a 60 y.o. female who presents for the following: Annual Exam (Dry itchy spots on elbows, back of knees & hands- tx tac & clobetasol cream - in past - ran out of med. No family or personal history of melanoma or non mole skin cancers. ).   The following portions of the chart were reviewed this encounter and updated as appropriate:  Tobacco   Allergies   Meds   Problems   Med Hx   Surg Hx   Fam Hx       Objective  Well appearing patient in no apparent distress; mood and affect are within normal limits.  A full examination was performed including scalp, head, eyes, ears, nose, lips, neck, chest, axillae, abdomen, back, buttocks, bilateral upper extremities, bilateral lower extremities, hands, feet, fingers, toes, fingernails, and toenails. All findings within normal limits unless otherwise noted below.  Right Foot - Anterior Stuck-on, waxy papules and plaques.   Left Upper Arm - Posterior Large, soft, non mobile nodule.  Left Antecubital Fossa, Left Hand - Posterior, Left Popliteal Fossa, Right Antecubital Fossa, Right Hand - Posterior, Right Popliteal Fossa Ill-defined pink papules/plaques with scale-crust.   Left Breast Bichromic dark nested macule.      right cheek Erythematous patches with gritty scale.   Assessment & Plan  Seborrheic keratosis Right Foot - Anterior  Benign no treatment   Lipoma of left upper extremity Left Upper Arm - Posterior  Benign. Can see a surgeon for removal.   Flexural eczema Left Antecubital Fossa; Right Antecubital Fossa; Left Hand - Posterior; Right Hand - Posterior; Left Popliteal Fossa; Right Popliteal Fossa  Start Dupixent if fails  pimecrolimus (ELIDEL) 1 % cream - Left Antecubital Fossa, Left Hand - Posterior, Left Popliteal Fossa, Right Antecubital Fossa, Right Hand - Posterior, Right Popliteal Fossa Apply topically 2 (two) times daily.  triamcinolone cream (KENALOG) 0.1 % - Left  Antecubital Fossa, Left Hand - Posterior, Left Popliteal Fossa, Right Antecubital Fossa, Right Hand - Posterior, Right Popliteal Fossa Apply 1 application topically daily as needed.  Neoplasm of uncertain behavior of skin Left Breast  Skin / nail biopsy Type of biopsy: tangential   Informed consent: discussed and consent obtained   Timeout: patient name, date of birth, surgical site, and procedure verified   Anesthesia: the lesion was anesthetized in a standard fashion   Anesthetic:  1% lidocaine w/ epinephrine 1-100,000 local infiltration Instrument used: flexible razor blade   Hemostasis achieved with: ferric subsulfate   Outcome: patient tolerated procedure well   Post-procedure details: wound care instructions given    Specimen 1 - Surgical pathology Differential Diagnosis: atypia  Check Margins: yes  AK (actinic keratosis) right cheek  Destruction of lesion - right cheek Complexity: simple   Destruction method: cryotherapy   Informed consent: discussed and consent obtained   Timeout:  patient name, date of birth, surgical site, and procedure verified Lesion destroyed using liquid nitrogen: Yes   Cryotherapy cycles:  1 Outcome: patient tolerated procedure well with no complications   Post-procedure details: wound care instructions given       I, Linsie Lupo, PA-C, have reviewed all documentation's for this visit.  The documentation on 11/14/21 for the exam, diagnosis, procedures and orders are all accurate and complete.

## 2021-11-14 NOTE — Patient Instructions (Signed)

## 2021-11-20 ENCOUNTER — Telehealth: Payer: Self-pay

## 2021-11-20 NOTE — Telephone Encounter (Signed)
-----   Message from Warren Danes, [place] sent at 11/20/2021  3:35 PM EST ----- She had a mod dn. Rtc if recurs.  thanks

## 2021-11-20 NOTE — Telephone Encounter (Signed)
Phone call to patient with her pathology results. Patient aware of results.  

## 2021-11-27 ENCOUNTER — Telehealth: Payer: Self-pay | Admitting: *Deleted

## 2021-11-27 NOTE — Telephone Encounter (Signed)
Phone call to patient to check and see if she still wants to do the [place]. Patient does not want to at this time. Will continue treatment with topicals.

## 2022-05-10 DIAGNOSIS — Z1231 Encounter for screening mammogram for malignant neoplasm of breast: Secondary | ICD-10-CM | POA: Diagnosis not present

## 2022-05-10 LAB — HM MAMMOGRAPHY

## 2022-06-05 ENCOUNTER — Ambulatory Visit (INDEPENDENT_AMBULATORY_CARE_PROVIDER_SITE_OTHER): Payer: 59 | Admitting: Emergency Medicine

## 2022-06-05 ENCOUNTER — Encounter: Payer: Self-pay | Admitting: Emergency Medicine

## 2022-06-05 VITALS — BP 116/64 | HR 75 | Temp 98.9°F | Ht 61.0 in | Wt 151.5 lb

## 2022-06-05 DIAGNOSIS — Z13228 Encounter for screening for other metabolic disorders: Secondary | ICD-10-CM | POA: Diagnosis not present

## 2022-06-05 DIAGNOSIS — D1722 Benign lipomatous neoplasm of skin and subcutaneous tissue of left arm: Secondary | ICD-10-CM

## 2022-06-05 DIAGNOSIS — Z0000 Encounter for general adult medical examination without abnormal findings: Secondary | ICD-10-CM | POA: Diagnosis not present

## 2022-06-05 DIAGNOSIS — Z13220 Encounter for screening for lipoid disorders: Secondary | ICD-10-CM | POA: Diagnosis not present

## 2022-06-05 DIAGNOSIS — Z130 Encounter for screening for diseases of the blood and blood-forming organs and certain disorders involving the immune mechanism: Secondary | ICD-10-CM

## 2022-06-05 DIAGNOSIS — Z1329 Encounter for screening for other suspected endocrine disorder: Secondary | ICD-10-CM

## 2022-06-05 LAB — COMPREHENSIVE METABOLIC PANEL
ALT: 19 U/L (ref 0–35)
AST: 20 U/L (ref 0–37)
Albumin: 4.7 g/dL (ref 3.5–5.2)
Alkaline Phosphatase: 94 U/L (ref 39–117)
BUN: 10 mg/dL (ref 6–23)
CO2: 30 mEq/L (ref 19–32)
Calcium: 10 mg/dL (ref 8.4–10.5)
Chloride: 101 mEq/L (ref 96–112)
Creatinine, Ser: 0.72 mg/dL (ref 0.40–1.20)
GFR: 90.29 mL/min (ref >60.00)
Glucose, Bld: 92 mg/dL (ref 70–99)
Potassium: 4.5 mEq/L (ref 3.5–5.1)
Sodium: 138 mEq/L (ref 135–145)
Total Bilirubin: 0.5 mg/dL (ref 0.2–1.2)
Total Protein: 7.7 g/dL (ref 6.0–8.3)

## 2022-06-05 LAB — CBC WITH DIFFERENTIAL/PLATELET
Basophils Absolute: 0 10*3/uL (ref 0.0–0.1)
Basophils Relative: 0.6 % (ref 0.0–3.0)
Eosinophils Absolute: 0.2 10*3/uL (ref 0.0–0.7)
Eosinophils Relative: 3 % (ref 0.0–5.0)
HCT: 40.6 % (ref 36.0–46.0)
Hemoglobin: 12.6 g/dL (ref 12.0–15.0)
Lymphocytes Relative: 39.4 % (ref 12.0–46.0)
Lymphs Abs: 2.7 10*3/uL (ref 0.7–4.0)
MCHC: 31 g/dL (ref 30.0–36.0)
MCV: 68.2 fl — ABNORMAL LOW (ref 78.0–100.0)
Monocytes Absolute: 0.3 10*3/uL (ref 0.1–1.0)
Monocytes Relative: 4.8 % (ref 3.0–12.0)
Neutro Abs: 3.6 10*3/uL (ref 1.4–7.7)
Neutrophils Relative %: 52.2 % (ref 43.0–77.0)
Platelets: 221 10*3/uL (ref 150.0–400.0)
RBC: 5.95 Mil/uL — ABNORMAL HIGH (ref 3.87–5.11)
RDW: 14.8 % (ref 11.5–15.5)
WBC: 6.9 10*3/uL (ref 4.0–10.5)

## 2022-06-05 LAB — LIPID PANEL
Cholesterol: 236 mg/dL — ABNORMAL HIGH (ref 0–200)
HDL: 38.7 mg/dL — ABNORMAL LOW (ref >39.00)
NonHDL: 197.46
Total CHOL/HDL Ratio: 6
Triglycerides: 224 mg/dL — ABNORMAL HIGH (ref 0.0–149.0)
VLDL: 44.8 mg/dL — ABNORMAL HIGH (ref 0.0–40.0)

## 2022-06-05 LAB — HEMOGLOBIN A1C: Hgb A1c MFr Bld: 5.6 % (ref 4.6–6.5)

## 2022-06-05 LAB — LDL CHOLESTEROL, DIRECT: Direct LDL: 149 mg/dL

## 2022-06-05 NOTE — Progress Notes (Signed)
Vanessa Nixon 61 y.o.   Chief Complaint  Patient presents with   Follow-up    Want a referral      HISTORY OF PRESENT ILLNESS: This is a 61 y.o. female here for annual exam. Healthy lifestyle. Has history of dyslipidemia with elevated triglycerides but not taking rosuvastatin at present time. 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  Requesting referral to general surgeon. No other complaints or medical concerns today.  HPI   Prior to Admission medications   Medication Sig Start Date End Date Taking? Authorizing Provider  IRON PO Take by mouth.   Yes [provider]  Multiple Vitamins-Minerals (MULTIVITAMIN WITH MINERALS) tablet Take 1 tablet by mouth daily.   Yes [provider]  pimecrolimus (ELIDEL) 1 % cream Apply topically 2 (two) times daily. 11/14/21  Yes Sheffield, Kelli R, PA-C  rosuvastatin (CRESTOR) 10 MG tablet Take 1 tablet (10 mg total) by mouth daily. Patient not taking: Reported on 11/14/2021 05/09/21   Horald Pollen, MD  triamcinolone cream (KENALOG) 0.1 % Apply 1 application topically daily as needed. Patient not taking: Reported on 06/05/2022 11/14/21   Warren Danes, PA-C    Allergies  Allergen Reactions   Codeine Itching    Patient Active Problem List   Diagnosis Date Noted   Dyslipidemia 05/04/2020   Abnormal vaginal bleeding 05/04/2020    Past Medical History:  Diagnosis Date   Allergy    Anemia    Arthritis     Past Surgical History:  Procedure Laterality Date   CESAREAN SECTION     COLONOSCOPY     30 plus years ago   WISDOM TOOTH EXTRACTION      Social History   Socioeconomic History   Marital status: Married    Spouse name: Not on file   Number of children: 5   Years of education: Not on file   Highest education level: Bachelor's degree (e.g., BA, AB, BS)  Occupational History   Not on file  Tobacco Use   Smoking status: Never   Smokeless tobacco: Never  Vaping Use    Vaping Use: Never used  Substance and Sexual Activity   Alcohol use: Never    Alcohol/week: 0.0 standard drinks of alcohol   Drug use: No   Sexual activity: Yes    Partners: Male    Birth control/protection: Condom    Comment: with monogamous husband-1st intercourse 19 yo-Fewer than 5 partners  Other Topics Concern   Not on file  Social History Narrative   Pt is from [place]. Works at [employer] with [employer]. Lives at home with husband and 4 of 5 children.    Social Determinants of Health   Financial Resource Strain: Low Risk  (05/18/2018)   Overall Financial Resource Strain (CARDIA)    Difficulty of Paying Living Expenses: Not hard at all  Food Insecurity: No Food Insecurity (05/18/2018)   Hunger Vital Sign    Worried About Running Out of Food in the Last Year: Never true    Ran Out of Food in the Last Year: Never true  Transportation Needs: No Transportation Needs (05/18/2018)   PRAPARE - Hydrologist (Medical): No    Lack of Transportation (Non-Medical): No  Physical Activity: Inactive (05/18/2018)   Exercise Vital Sign    Days of Exercise per Week: 0 days    Minutes of Exercise per Session: 0 min  Stress: No Stress Concern Present (05/18/2018)  [place] Questionnaire    Feeling of Stress : Only a little  Social Connections: Socially Integrated (05/18/2018)   Social Connection and Isolation Panel [NHANES]    Frequency of Communication with Friends and Family: More than three times a week    Frequency of Social Gatherings with Friends and Family: Once a week    Attends Religious Services: More than 4 times per year    Active Member of [employer] or Organizations: Yes    Attends Music therapist: More than 4 times per year    Marital Status: Married  Human resources officer Violence: Not At Risk (05/18/2018)   Humiliation, Afraid, Rape, and Kick questionnaire    Fear of Current or  Ex-Partner: No    Emotionally Abused: No    Physically Abused: No    Sexually Abused: No    Family History  Problem Relation Age of Onset   COPD Mother    Arthritis Mother    Breast cancer Mother 17   Alzheimer's disease Father    Cancer Father        Prostate,Multiple myloma   Thyroid disease Father    Cancer Sister        Liver   Breast cancer Sister 30   Colon cancer Paternal Aunt    Colon polyps Neg Hx    Esophageal cancer Neg Hx    Rectal cancer Neg Hx    Stomach cancer Neg Hx      Review of Systems  Constitutional: Negative.  Negative for chills and fever.  HENT: Negative.  Negative for congestion and sore throat.   Respiratory: Negative.  Negative for cough and shortness of breath.   Cardiovascular: Negative.  Negative for chest pain and palpitations.  Gastrointestinal: Negative.  Negative for abdominal pain, diarrhea, nausea and vomiting.  Genitourinary: Negative.  Negative for dysuria.  Musculoskeletal: Negative.   Skin: Negative.  Negative for rash.  Neurological: Negative.  Negative for dizziness and headaches.  All other systems reviewed and are negative.  Vitals:   06/05/22 1044  BP: 116/64  Pulse: 75  Temp: 98.9 F (37.2 C)  SpO2: 96%     Physical Exam Vitals reviewed.  Constitutional:      Appearance: Normal appearance.  HENT:     Head: Normocephalic.     Right Ear: Tympanic membrane, ear canal and external ear normal.     Left Ear: Tympanic membrane, ear canal and external ear normal.     Mouth/Throat:     Mouth: Mucous membranes are moist.     Pharynx: Oropharynx is clear.  Eyes:     Extraocular Movements: Extraocular movements intact.     Conjunctiva/sclera: Conjunctivae normal.     Pupils: Pupils are equal, round, and reactive to light.  Neck:     Vascular: No carotid bruit.  Cardiovascular:     Rate and Rhythm: Normal rate and regular rhythm.     Pulses: Normal pulses.     Heart sounds: Normal heart sounds.  Pulmonary:      Effort: Pulmonary effort is normal.     Breath sounds: Normal breath sounds.  Abdominal:     General: There is no distension.     Palpations: Abdomen is soft. There is no mass.     Tenderness: There is no abdominal tenderness.  Musculoskeletal:     Cervical back: No tenderness.  Lymphadenopathy:     Cervical: No cervical adenopathy.  Skin:    General: Skin is  warm and dry.     Capillary Refill: Capillary refill takes less than 2 seconds.     Comments: Left upper arm: Large lipoma to the triceps area  Neurological:     General: No focal deficit present.     Mental Status: She is alert and oriented to person, place, and time.  Psychiatric:        Mood and Affect: Mood normal.        Behavior: Behavior normal.      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 Anticipatory guidance according to age provided. The following topics were also discussed: Social Determinants of Health Smoking.  Non-smoker Diet and nutrition Benefits of exercise Cancer screening and review of most recent mammogram and colonoscopy reports Vaccinations recommendations Cardiovascular risk assessment The 10-year ASCVD risk score (Arnett DK, et al., 2019) is: 4.2%   Values used to calculate the score:     Age: 42 years     Sex: Female     Is Non-Hispanic African American: No     Diabetic: No     Tobacco smoker:  No     Systolic Blood Pressure: 779 mmHg     Is BP treated: No     HDL Cholesterol: 35.7 mg/dL     Total Cholesterol: 218 mg/dL Review of dermatologist office visit from last December and skin biopsy report. Mental health including depression and anxiety Fall and accident prevention  Patient Instructions  Health Maintenance, Female Adopting a healthy lifestyle and getting preventive care are important in promoting health and wellness. Ask your health care provider about: The right schedule for you to have regular tests and exams. Things you can do on your own to prevent diseases and keep yourself healthy. What should I know about diet, weight, and exercise? Eat a healthy diet  Eat a diet that includes plenty of vegetables, fruits, low-fat dairy products, and lean protein. Do not eat a lot of foods that are high in solid fats, added sugars, or sodium. Maintain a healthy weight Body mass index (BMI) is used to identify weight problems. It estimates body fat based on height and weight. Your health care provider can help determine your BMI and help you achieve or maintain a healthy weight. Get regular exercise Get regular exercise. This is one of the most important things you can do for your health. Most adults should: Exercise for at least 150 minutes each week. The exercise should increase your heart rate and make you sweat (moderate-intensity exercise). Do strengthening exercises at least twice a week. This is in addition to the moderate-intensity exercise. Spend less time sitting. Even light physical activity can be beneficial. Watch cholesterol and blood lipids Have your blood tested for lipids and cholesterol at 61 years  of age, then have this test every 5 years. Have your cholesterol levels checked more often if: Your lipid or cholesterol levels are high. You are older than 61 years of age. You are at high risk for heart disease. What should I know about cancer  screening? Depending on your health history and family history, you may need to have cancer screening at various ages. This may include screening for: Breast cancer. Cervical cancer. Colorectal cancer. Skin cancer. Lung cancer. What should I know about heart disease, diabetes, and high blood pressure? Blood pressure and heart disease High blood pressure causes heart disease and increases the risk of stroke. This is more likely to develop in people who have high blood pressure readings or are overweight. Have your blood pressure checked: Every 3-5 years if you are 8-30 years of age. Every year if you are 47 years old or older. Diabetes Have regular diabetes screenings. This checks your fasting blood sugar level. Have the screening done: Once every three years after age 5 if you are at a normal weight and have a low risk for diabetes. More often and at a younger age if you are overweight or have a high risk for diabetes. What should I know about preventing infection? Hepatitis B If you have a higher risk for hepatitis B, you should be screened for this virus. Talk with your health care provider to find out if you are at risk for hepatitis B infection. Hepatitis C Testing is recommended for: Everyone born from [birth year] through 1965. Anyone with known risk factors for hepatitis C. Sexually transmitted infections (STIs) Get screened for STIs, including gonorrhea and chlamydia, if: You are sexually active and are younger than 61 years of age. You are older than 61 years of age and your health care provider tells you that you are at risk for this type of infection. Your sexual activity has changed since you were last screened, and you are at increased risk for chlamydia or gonorrhea. Ask your health care provider if you are at risk. Ask your health care provider about whether you are at high risk for HIV. Your health care provider may recommend a prescription medicine to help prevent HIV  infection. If you choose to take medicine to prevent HIV, you should first get tested for HIV. You should then be tested every 3 months for as long as you are taking the medicine. Pregnancy If you are about to stop having your period (premenopausal) and you may become pregnant, seek counseling before you get pregnant. Take 400 to 800 micrograms (mcg) of folic acid every day if you become pregnant. Ask for birth control (contraception) if you want to prevent pregnancy. Osteoporosis and menopause Osteoporosis is a disease in which the bones lose minerals and strength with aging. This can result in bone fractures. If you are 54 years old or older, or if you are at risk for osteoporosis and fractures, ask your health care provider if you should: Be screened for bone loss. Take a calcium or vitamin D supplement to lower your risk of fractures. Be given hormone replacement therapy (HRT) to treat symptoms of menopause. Follow these instructions at home: Alcohol use Do not drink alcohol if: Your health care provider tells you not to drink. You are pregnant, may be pregnant, or are planning to become pregnant. If you drink alcohol: Limit how much you have to: 0-1 drink a day. Know how much alcohol is in your drink. In the [place] one drink equals one  12 oz bottle of beer (355 mL), one 5 oz glass of wine (148 mL), or one 1 oz glass of hard liquor (44 mL). Lifestyle Do not use any products that contain nicotine or tobacco. These products include cigarettes, chewing tobacco, and vaping devices, such as e-cigarettes. If you need help quitting, ask your health care provider. Do not use street drugs. Do not share needles. Ask your health care provider for help if you need support or information about quitting drugs. General instructions Schedule regular health, dental, and eye exams. Stay current with your vaccines. Tell your health care provider if: You often feel depressed. You have ever been abused  or do not feel safe at home. Summary Adopting a healthy lifestyle and getting preventive care are important in promoting health and wellness. Follow your health care provider's instructions about healthy diet, exercising, and getting tested or screened for diseases. Follow your health care provider's instructions on monitoring your cholesterol and blood pressure. This information is not intended to replace advice given to you by your health care provider. Make sure you discuss any questions you have with your health care provider. Document Revised: 04/09/2021 Document Reviewed: 04/09/2021 Elsevier Patient Education  [place], MD [facility]

## 2022-06-05 NOTE — Patient Instructions (Signed)

## 2022-07-23 ENCOUNTER — Ambulatory Visit: Payer: 59 | Admitting: Internal Medicine

## 2022-07-23 ENCOUNTER — Encounter: Payer: Self-pay | Admitting: Internal Medicine

## 2022-07-23 VITALS — BP 122/76 | HR 82 | Temp 98.5°F | Ht 61.0 in | Wt 151.0 lb

## 2022-07-23 DIAGNOSIS — S39012A Strain of muscle, fascia and tendon of lower back, initial encounter: Secondary | ICD-10-CM | POA: Insufficient documentation

## 2022-07-23 MED ORDER — METHOCARBAMOL 500 MG PO TABS: 500.0000 mg | ORAL_TABLET | Freq: Four times a day (QID) | ORAL | 1 refills | Status: DC

## 2022-07-23 MED ORDER — KETOROLAC TROMETHAMINE 30 MG/ML IJ SOLN
30.0000 mg | Freq: Once | INTRAMUSCULAR | Status: AC
  Administered 2022-07-23: 30 mg via INTRAVENOUS

## 2022-07-23 MED ORDER — TRAMADOL HCL 50 MG PO TABS: 50.0000 mg | ORAL_TABLET | Freq: Four times a day (QID) | ORAL | 0 refills | Status: DC | PRN

## 2022-07-23 NOTE — Addendum Note (Signed)
Addended by: Max Sane on: 07/23/2022 10:17 AM   Modules accepted: Orders

## 2022-07-23 NOTE — Assessment & Plan Note (Signed)
Exam c/w simple but mod to severe msk strain; today for toradol 30 mg IM x 1, also tramadol prn, robaxin prn, and off work note this wk.  Hopeful for back to work Aug28 2023

## 2022-07-23 NOTE — Patient Instructions (Signed)
You had the Toradol 30 mg shot today for pain  Please take all new medication as prescribed - the tramadol as needed for pain, and the robaxin as needed for muscle relaxer.    At this point heat for 30 min about 4 times per day would be helpful as needed  You are given the work note  Please continue all other medications as before, and refills have been done if requested.  Please have the pharmacy call with any other refills you may need.  Please keep your appointments with your specialists as you may have planned

## 2022-07-23 NOTE — Progress Notes (Signed)
Patient ID: Vanessa Nixon, female   DOB: 1961-04-25, 61 y.o.   MRN: [id number]        Chief Complaint: follow up left lower back pain acute x 4 days       HPI:  [name] is a 61 y.o. female here with c/o acute onset 4 days mod to severe left lower back pain without hx of fall, GU or GI symptoms, and no LE pain/numbness or weakness.  Has remote hx of similar in the past years ago with pregnancy. Worse to stand up, bend, or walk This episode started approx 1 days after working out at the gym doing overhead lifts. 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  Pt denies polydipsia, polyuria, or new focal neuro s/s.   No prior imaging or need in past.  States has been sensitive to flexeril in past with sleepiness so has some at home she does not take.        Wt Readings from Last 3 Encounters:  07/23/22 151 lb (68.5 kg)  06/05/22 151 lb 8 oz (68.7 kg)  05/07/21 154 lb 12.8 oz (70.2 kg)   BP Readings from Last 3 Encounters:  07/23/22 122/76  06/05/22 116/64  05/07/21 130/70         Past Medical History:  Diagnosis Date   Allergy    Anemia    Arthritis    Past Surgical History:  Procedure Laterality Date   CESAREAN SECTION     COLONOSCOPY     30 plus years ago   WISDOM TOOTH EXTRACTION      reports that she has never smoked. She has never used smokeless tobacco. She reports that she does not drink alcohol and does not use drugs. family history includes Alzheimer's disease in her father; Arthritis in her mother; Breast cancer (age of onset: 29) in her sister; Breast cancer (age of onset: 18) in her mother; COPD in her mother; Cancer in her father and sister; Colon cancer in her paternal aunt; Thyroid disease in her father. Allergies  Allergen Reactions   Codeine Itching   Current Outpatient Medications on File Prior to  Visit  Medication Sig Dispense Refill   IRON PO Take by mouth.     Multiple Vitamins-Minerals (MULTIVITAMIN WITH MINERALS) tablet Take 1 tablet by mouth daily.     pimecrolimus (ELIDEL) 1 % cream Apply topically 2 (two) times daily. 30 g 1   triamcinolone cream (KENALOG) 0.1 % Apply 1 application topically daily as needed. (Patient not taking: Reported on 06/05/2022) 453 g 2   No current facility-administered medications on file prior to visit.        ROS:  All others reviewed and negative.  Objective        PE:  BP 122/76 (BP Location: Left Arm, Patient Position: Sitting, Cuff Size: Large)   Pulse 82   Temp 98.5 F (36.9 C) (Oral)   Ht '5\' 1"'$  (1.549 m)   Wt 151 lb (68.5 kg)   SpO2 94%   BMI 28.53 kg/m                 Constitutional: Pt appears in NAD               HENT: Head: NCAT.  Right Ear: External ear normal.                 Left Ear: External ear normal.                Eyes: . Pupils are equal, round, and reactive to light. Conjunctivae and EOM are normal               Nose: without d/c or deformity               Neck: Neck supple. Gross normal ROM               Cardiovascular: Normal rate and regular rhythm.                 Pulmonary/Chest: Effort normal and breath sounds without rales or wheezing.                Abd:  Soft, NT, ND, + BS, no organomegaly               Neurological: Pt is alert. At baseline orientation, motor grossly intact               Skin: Skin is warm. No rashes, no other new lesions, LE edema - none; + mod to severe left lumbar paravertebral tender spasm, no tender in midline, no rash               Psychiatric: Pt behavior is normal without agitation   Micro: none  Cardiac tracings I have personally interpreted today:  none  Pertinent Radiological findings (summarize): none   Lab Results  Component Value Date   WBC 6.9 06/05/2022   HGB 12.6 06/05/2022   HCT 40.6 06/05/2022   PLT 221.0 06/05/2022   GLUCOSE 92 06/05/2022   CHOL  236 (H) 06/05/2022   TRIG 224.0 (H) 06/05/2022   HDL 38.70 (L) 06/05/2022   LDLDIRECT 149.0 06/05/2022   LDLCALC 149 (H) 04/28/2020   ALT 19 06/05/2022   AST 20 06/05/2022   NA 138 06/05/2022   K 4.5 06/05/2022   CL 101 06/05/2022   CREATININE 0.72 06/05/2022   BUN 10 06/05/2022   CO2 30 06/05/2022   TSH 2.41 05/07/2021   HGBA1C 5.6 06/05/2022   Assessment/Plan:  Vanessa Nixon is a 61 y.o. White or Caucasian [1] female with  has a past medical history of Allergy, Anemia, and Arthritis.  Low back strain, initial encounter Exam c/w simple but mod to severe msk strain; today for toradol 30 mg IM x 1, also tramadol prn, robaxin prn, and off work note this wk.  Hopeful for back to work Aug28 2023  Followup: Return if symptoms worsen or fail to improve.  Cathlean Cower, MD 07/23/2022 9:47 AM [facility]

## 2022-07-26 DIAGNOSIS — D1722 Benign lipomatous neoplasm of skin and subcutaneous tissue of left arm: Secondary | ICD-10-CM | POA: Diagnosis not present

## 2022-08-26 ENCOUNTER — Other Ambulatory Visit: Payer: Self-pay | Admitting: General Surgery

## 2022-08-26 DIAGNOSIS — D1722 Benign lipomatous neoplasm of skin and subcutaneous tissue of left arm: Secondary | ICD-10-CM | POA: Diagnosis not present

## 2023-06-09 ENCOUNTER — Ambulatory Visit (INDEPENDENT_AMBULATORY_CARE_PROVIDER_SITE_OTHER): Payer: Commercial Managed Care - PPO | Admitting: Emergency Medicine

## 2023-06-09 ENCOUNTER — Encounter: Payer: Self-pay | Admitting: Emergency Medicine

## 2023-06-09 VITALS — BP 128/82 | HR 75 | Temp 98.0°F | Ht 61.0 in | Wt 151.1 lb

## 2023-06-09 DIAGNOSIS — Z0000 Encounter for general adult medical examination without abnormal findings: Secondary | ICD-10-CM | POA: Diagnosis not present

## 2023-06-09 DIAGNOSIS — Z13220 Encounter for screening for lipoid disorders: Secondary | ICD-10-CM | POA: Diagnosis not present

## 2023-06-09 DIAGNOSIS — Z130 Encounter for screening for diseases of the blood and blood-forming organs and certain disorders involving the immune mechanism: Secondary | ICD-10-CM | POA: Diagnosis not present

## 2023-06-09 DIAGNOSIS — Z1329 Encounter for screening for other suspected endocrine disorder: Secondary | ICD-10-CM | POA: Diagnosis not present

## 2023-06-09 DIAGNOSIS — Z13228 Encounter for screening for other metabolic disorders: Secondary | ICD-10-CM | POA: Diagnosis not present

## 2023-06-09 LAB — COMPREHENSIVE METABOLIC PANEL
ALT: 18 U/L (ref 0–35)
AST: 19 U/L (ref 0–37)
Albumin: 4.5 g/dL (ref 3.5–5.2)
Alkaline Phosphatase: 80 U/L (ref 39–117)
BUN: 16 mg/dL (ref 6–23)
CO2: 31 mEq/L (ref 19–32)
Calcium: 10.1 mg/dL (ref 8.4–10.5)
Chloride: 100 mEq/L (ref 96–112)
Creatinine, Ser: 0.73 mg/dL (ref 0.40–1.20)
GFR: 88.18 mL/min (ref >60.00)
Glucose, Bld: 97 mg/dL (ref 70–99)
Potassium: 4.4 mEq/L (ref 3.5–5.1)
Sodium: 139 mEq/L (ref 135–145)
Total Bilirubin: 0.9 mg/dL (ref 0.2–1.2)
Total Protein: 7.9 g/dL (ref 6.0–8.3)

## 2023-06-09 LAB — LIPID PANEL
Cholesterol: 258 mg/dL — ABNORMAL HIGH (ref 0–200)
HDL: 38.2 mg/dL — ABNORMAL LOW (ref >39.00)
NonHDL: 220.01
Total CHOL/HDL Ratio: 7
Triglycerides: 294 mg/dL — ABNORMAL HIGH (ref 0.0–149.0)
VLDL: 58.8 mg/dL — ABNORMAL HIGH (ref 0.0–40.0)

## 2023-06-09 LAB — CBC WITH DIFFERENTIAL/PLATELET
Basophils Absolute: 0.1 10*3/uL (ref 0.0–0.1)
Basophils Relative: 0.6 % (ref 0.0–3.0)
Eosinophils Absolute: 0.2 10*3/uL (ref 0.0–0.7)
Eosinophils Relative: 2.8 % (ref 0.0–5.0)
HCT: 40 % (ref 36.0–46.0)
Hemoglobin: 12.2 g/dL (ref 12.0–15.0)
Lymphocytes Relative: 36.4 % (ref 12.0–46.0)
Lymphs Abs: 3.2 10*3/uL (ref 0.7–4.0)
MCHC: 30.5 g/dL (ref 30.0–36.0)
MCV: 68.9 fl — ABNORMAL LOW (ref 78.0–100.0)
Monocytes Absolute: 0.4 10*3/uL (ref 0.1–1.0)
Monocytes Relative: 4.7 % (ref 3.0–12.0)
Neutro Abs: 4.9 10*3/uL (ref 1.4–7.7)
Neutrophils Relative %: 55.5 % (ref 43.0–77.0)
Platelets: 226 10*3/uL (ref 150.0–400.0)
RBC: 5.81 Mil/uL — ABNORMAL HIGH (ref 3.87–5.11)
RDW: 14.9 % (ref 11.5–15.5)
WBC: 8.9 10*3/uL (ref 4.0–10.5)

## 2023-06-09 LAB — HEMOGLOBIN A1C: Hgb A1c MFr Bld: 5.6 % (ref 4.6–6.5)

## 2023-06-09 LAB — LDL CHOLESTEROL, DIRECT: Direct LDL: 163 mg/dL

## 2023-06-09 NOTE — Patient Instructions (Signed)

## 2023-06-09 NOTE — Progress Notes (Signed)
Vanessa Nixon 62 y.o.   Chief Complaint  Patient presents with   Annual Exam    No concerns     HISTORY OF PRESENT ILLNESS: This is a 62 y.o. female here for annual exam. Has no complaints or medical concerns today.  HPI   Prior to Admission medications   Not on File    Allergies  Allergen Reactions   Codeine Itching    Patient Active Problem List   Diagnosis Date Noted   Dyslipidemia 05/04/2020    Past Medical History:  Diagnosis Date   Allergy    Anemia    Arthritis     Past Surgical History:  Procedure Laterality Date   CESAREAN SECTION     COLONOSCOPY     30 plus years ago   WISDOM TOOTH EXTRACTION      Social History   Socioeconomic History   Marital status: Married    Spouse name: Not on file   Number of children: 5   Years of education: Not on file   Highest education level: Bachelor's degree (e.g., BA, AB, BS)  Occupational History   Not on file  Tobacco Use   Smoking status: Never   Smokeless tobacco: Never  Vaping Use   Vaping Use: Never used  Substance and Sexual Activity   Alcohol use: Never    Alcohol/week: 0.0 standard drinks of alcohol   Drug use: No   Sexual activity: Yes    Partners: Male    Birth control/protection: Condom    Comment: with monogamous husband-1st intercourse 19 yo-Fewer than 5 partners  Other Topics Concern   Not on file  Social History Narrative   Pt is from [place]. Works at [employer] with [employer]. Lives at home with husband and 4 of 5 children.    Social Determinants of Health   Financial Resource Strain: Low Risk  (05/18/2018)   Overall Financial Resource Strain (CARDIA)    Difficulty of Paying Living Expenses: Not hard at all  Food Insecurity: No Food Insecurity (05/18/2018)   Hunger Vital Sign    Worried About Running Out of Food in the Last Year: Never true    Ran Out of Food in the Last Year: Never true  Transportation Needs: No Transportation Needs (05/18/2018)   PRAPARE -  Administrator, Civil Service (Medical): No    Lack of Transportation (Non-Medical): No  Physical Activity: Inactive (05/18/2018)   Exercise Vital Sign    Days of Exercise per Week: 0 days    Minutes of Exercise per Session: 0 min  Stress: No Stress Concern Present (05/18/2018)   [employer] of Occupational Health - Occupational Stress Questionnaire    Feeling of Stress : Only a little  Social Connections: Socially Integrated (05/18/2018)   Social Connection and Isolation Panel [NHANES]    Frequency of Communication with Friends and Family: More than three times a week    Frequency of Social Gatherings with Friends and Family: Once a week    Attends Religious Services: More than 4 times per year    Active Member of [employer] or Organizations: Yes    Attends Banker Meetings: More than 4 times per year    Marital Status: Married  Catering manager Violence: Not At Risk (05/18/2018)   Humiliation, Afraid, Rape, and Kick questionnaire    Fear of Current or Ex-Partner: No    Emotionally Abused: No    Physically Abused: No    Sexually Abused: No  Family History  Problem Relation Age of Onset   COPD Mother    Arthritis Mother    Breast cancer Mother 81   Alzheimer's disease Father    Cancer Father        Prostate,Multiple myloma   Thyroid disease Father    Cancer Sister        Liver   Breast cancer Sister 86   Colon cancer Paternal Aunt    Colon polyps Neg Hx    Esophageal cancer Neg Hx    Rectal cancer Neg Hx    Stomach cancer Neg Hx      Review of Systems  Constitutional: Negative.  Negative for chills and fever.  HENT: Negative.  Negative for congestion.   Cardiovascular: Negative.  Negative for chest pain and palpitations.  Gastrointestinal:  Negative for abdominal pain, nausea and vomiting.  Genitourinary: Negative.  Negative for dysuria and hematuria.  Skin: Negative.  Negative for rash.  Neurological: Negative.  Negative for dizziness and  headaches.  All other systems reviewed and are negative.   Wt Readings from Last 3 Encounters:  06/09/23 151 lb 2 oz (68.5 kg)  07/23/22 151 lb (68.5 kg)  06/05/22 151 lb 8 oz (68.7 kg)     Physical Exam Vitals reviewed.  Constitutional:      Appearance: Normal appearance.  HENT:     Right Ear: Tympanic membrane, ear canal and external ear normal.     Left Ear: Tympanic membrane, ear canal and external ear normal.     Mouth/Throat:     Mouth: Mucous membranes are moist.     Pharynx: Oropharynx is clear.  Eyes:     Extraocular Movements: Extraocular movements intact.     Pupils: Pupils are equal, round, and reactive to light.  Cardiovascular:     Rate and Rhythm: Normal rate and regular rhythm.     Pulses: Normal pulses.     Heart sounds: Normal heart sounds.  Pulmonary:     Effort: Pulmonary effort is normal.     Breath sounds: Normal breath sounds.  Abdominal:     Palpations: Abdomen is soft.     Tenderness: There is no abdominal tenderness.  Musculoskeletal:     Cervical back: No tenderness.  Lymphadenopathy:     Cervical: No cervical adenopathy.  Skin:    General: Skin is warm and dry.     Capillary Refill: Capillary refill takes less than 2 seconds.  Neurological:     Mental Status: She is alert and oriented to person, place, and time.  Psychiatric:        Mood and Affect: Mood normal.        Behavior: Behavior normal.      ASSESSMENT & PLAN: Problem List Items Addressed This Visit   None Visit Diagnoses     Routine general medical examination at a health care facility    -  Primary   Relevant Orders   CBC with Differential   Comprehensive metabolic panel   Hemoglobin A1c   Lipid panel   Screening for deficiency anemia       Relevant Orders   CBC with Differential   Screening for lipoid disorders       Relevant Orders   Lipid panel   Screening for endocrine, metabolic and immunity disorder       Relevant Orders   Comprehensive metabolic panel    Hemoglobin A1c      Modifiable risk factors discussed with patient. Anticipatory guidance according to age  provided. The following topics were also discussed: Social Determinants of Health Smoking.  Non-smoker Diet and nutrition Benefits of exercise Cancer screening and review of most recent colonoscopy from 2019 and mammogram results done last year Vaccinations reviewed and recommendations Cardiovascular risk assessment and need for blood work Mental health including depression and anxiety Fall and accident prevention  Patient Instructions  Health Maintenance, Female Adopting a healthy lifestyle and getting preventive care are important in promoting health and wellness. Ask your health care provider about: The right schedule for you to have regular tests and exams. Things you can do on your own to prevent diseases and keep yourself healthy. What should I know about diet, weight, and exercise? Eat a healthy diet  Eat a diet that includes plenty of vegetables, fruits, low-fat dairy products, and lean protein. Do not eat a lot of foods that are high in solid fats, added sugars, or sodium. Maintain a healthy weight Body mass index (BMI) is used to identify weight problems. It estimates body fat based on height and weight. Your health care provider can help determine your BMI and help you achieve or maintain a healthy weight. Get regular exercise Get regular exercise. This is one of the most important things you can do for your health. Most adults should: Exercise for at least 150 minutes each week. The exercise should increase your heart rate and make you sweat (moderate-intensity exercise). Do strengthening exercises at least twice a week. This is in addition to the moderate-intensity exercise. Spend less time sitting. Even light physical activity can be beneficial. Watch cholesterol and blood lipids Have your blood tested for lipids and cholesterol at 62 years of age, then have  this test every 5 years. Have your cholesterol levels checked more often if: Your lipid or cholesterol levels are high. You are older than 62 years of age. You are at high risk for heart disease. What should I know about cancer screening? Depending on your health history and family history, you may need to have cancer screening at various ages. This may include screening for: Breast cancer. Cervical cancer. Colorectal cancer. Skin cancer. Lung cancer. What should I know about heart disease, diabetes, and high blood pressure? Blood pressure and heart disease High blood pressure causes heart disease and increases the risk of stroke. This is more likely to develop in people who have high blood pressure readings or are overweight. Have your blood pressure checked: Every 3-5 years if you are 54-43 years of age. Every year if you are 3 years old or older. Diabetes Have regular diabetes screenings. This checks your fasting blood sugar level. Have the screening done: Once every three years after age 41 if you are at a normal weight and have a low risk for diabetes. More often and at a younger age if you are overweight or have a high risk for diabetes. What should I know about preventing infection? Hepatitis B If you have a higher risk for hepatitis B, you should be screened for this virus. Talk with your health care provider to find out if you are at risk for hepatitis B infection. Hepatitis C Testing is recommended for: Everyone born from [birth year] through 1965. Anyone with known risk factors for hepatitis C. Sexually transmitted infections (STIs) Get screened for STIs, including gonorrhea and chlamydia, if: You are sexually active and are younger than 62 years of age. You are older than 62 years of age and your health care provider tells you that you are at risk  for this type of infection. Your sexual activity has changed since you were last screened, and you are at increased risk for  chlamydia or gonorrhea. Ask your health care provider if you are at risk. Ask your health care provider about whether you are at high risk for HIV. Your health care provider may recommend a prescription medicine to help prevent HIV infection. If you choose to take medicine to prevent HIV, you should first get tested for HIV. You should then be tested every 3 months for as long as you are taking the medicine. Pregnancy If you are about to stop having your period (premenopausal) and you may become pregnant, seek counseling before you get pregnant. Take 400 to 800 micrograms (mcg) of folic acid every day if you become pregnant. Ask for birth control (contraception) if you want to prevent pregnancy. Osteoporosis and menopause Osteoporosis is a disease in which the bones lose minerals and strength with aging. This can result in bone fractures. If you are 31 years old or older, or if you are at risk for osteoporosis and fractures, ask your health care provider if you should: Be screened for bone loss. Take a calcium or vitamin D supplement to lower your risk of fractures. Be given hormone replacement therapy (HRT) to treat symptoms of menopause. Follow these instructions at home: Alcohol use Do not drink alcohol if: Your health care provider tells you not to drink. You are pregnant, may be pregnant, or are planning to become pregnant. If you drink alcohol: Limit how much you have to: 0-1 drink a day. Know how much alcohol is in your drink. In the [place] one drink equals one 12 oz bottle of beer (355 mL), one 5 oz glass of wine (148 mL), or one 1 oz glass of hard liquor (44 mL). Lifestyle Do not use any products that contain nicotine or tobacco. These products include cigarettes, chewing tobacco, and vaping devices, such as e-cigarettes. If you need help quitting, ask your health care provider. Do not use street drugs. Do not share needles. Ask your health care provider for help if you need support  or information about quitting drugs. General instructions Schedule regular health, dental, and eye exams. Stay current with your vaccines. Tell your health care provider if: You often feel depressed. You have ever been abused or do not feel safe at home. Summary Adopting a healthy lifestyle and getting preventive care are important in promoting health and wellness. Follow your health care provider's instructions about healthy diet, exercising, and getting tested or screened for diseases. Follow your health care provider's instructions on monitoring your cholesterol and blood pressure. This information is not intended to replace advice given to you by your health care provider. Make sure you discuss any questions you have with your health care provider. Document Revised: 04/09/2021 Document Reviewed: 04/09/2021 Elsevier Patient Education  2024 Elsevier Inc.     Edwina Barth, MD [facility] at [facility]

## 2023-07-16 DIAGNOSIS — Z1231 Encounter for screening mammogram for malignant neoplasm of breast: Secondary | ICD-10-CM | POA: Diagnosis not present

## 2023-07-16 LAB — HM MAMMOGRAPHY

## 2023-07-17 ENCOUNTER — Encounter: Payer: Self-pay | Admitting: Emergency Medicine

## 2023-08-12 DIAGNOSIS — S62336A Displaced fracture of neck of fifth metacarpal bone, right hand, initial encounter for closed fracture: Secondary | ICD-10-CM | POA: Diagnosis not present

## 2023-08-26 DIAGNOSIS — S62336A Displaced fracture of neck of fifth metacarpal bone, right hand, initial encounter for closed fracture: Secondary | ICD-10-CM | POA: Diagnosis not present

## 2023-09-08 DIAGNOSIS — S62336A Displaced fracture of neck of fifth metacarpal bone, right hand, initial encounter for closed fracture: Secondary | ICD-10-CM | POA: Diagnosis not present

## 2023-10-16 ENCOUNTER — Encounter: Payer: Self-pay | Admitting: Gastroenterology

## 2024-03-08 ENCOUNTER — Ambulatory Visit
Admission: RE | Admit: 2024-03-08 | Discharge: 2024-03-08 | Disposition: A | Discharge status: Home / Self Care | Source: Ambulatory Visit | Attending: Family Medicine | Admitting: Family Medicine

## 2024-03-08 ENCOUNTER — Ambulatory Visit (INDEPENDENT_AMBULATORY_CARE_PROVIDER_SITE_OTHER)

## 2024-03-08 ENCOUNTER — Ambulatory Visit
Admission: RE | Admit: 2024-03-08 | Discharge: 2024-03-08 | Disposition: A | Discharge status: Home / Self Care | Source: Ambulatory Visit

## 2024-03-08 DIAGNOSIS — R109 Unspecified abdominal pain: Secondary | ICD-10-CM | POA: Diagnosis not present

## 2024-03-08 DIAGNOSIS — I878 Other specified disorders of veins: Secondary | ICD-10-CM | POA: Diagnosis not present

## 2024-03-08 MED ORDER — ALUM & MAG HYDROXIDE-SIMETH 200-200-20 MG/5ML PO SUSP
30.0000 mL | Freq: Once | ORAL | Status: AC
  Administered 2024-03-08: 30 mL via ORAL

## 2024-03-08 MED ORDER — LIDOCAINE VISCOUS HCL 2 % MT SOLN
15.0000 mL | Freq: Once | OROMUCOSAL | Status: AC
  Administered 2024-03-08: 15 mL via OROMUCOSAL

## 2024-03-08 NOTE — ED Provider Notes (Signed)
 [name]    CSN: [id number] Arrival date & time: 03/08/24  1448      History   Chief Complaint Chief Complaint  Patient presents with   Abdominal Pain   Headache    HPI Vanessa Nixon is a 63 y.o. female.    Abdominal Pain Headache Associated symptoms: abdominal pain    Patient is here for stomach ache.  Ache started about 2 days ago, mild, and could not sleep.  Yesterday it felt okay, but then worsened throughout the day.  She did not really sleep last night due to pain.  Has a headache today  Unable to go to work.  Pain is at the whole upper abdomen.  No n/v.  She did have diarrhea x 1 today.  Today all she has eaten was water and saltines.  No fevers/chills.  She has not eating anything different/abnormal.  She did start a supplement "morning kick" that caused mouth sores.  She has had abd bloating  the last several weeks.  She has had reflux  in the past, but has not felt this recently.        Past Medical History:  Diagnosis Date   Allergy    Anemia    Arthritis     Patient Active Problem List   Diagnosis Date Noted   Dyslipidemia 05/04/2020    Past Surgical History:  Procedure Laterality Date   CESAREAN SECTION     COLONOSCOPY     30 plus years ago   WISDOM TOOTH EXTRACTION      OB History     Gravida  9   Para  5   Term      Preterm      AB  4   Living  5      SAB  2   IAB  2   Ectopic      Multiple      Live Births               Home Medications    Prior to Admission medications   Medication Sig Start Date End Date Taking? Authorizing Provider  UNABLE TO FIND Med Name: apple cider gummie, multivitamin, morning kick suppliment.   Yes [provider]    Family History Family History  Problem Relation Age of Onset   COPD Mother    Arthritis Mother    Breast cancer Mother 80   Alzheimer's disease Father    Cancer Father        Prostate,Multiple myloma   Thyroid disease Father     Cancer Sister        Liver   Breast cancer Sister 78   Colon cancer Paternal Aunt    Colon polyps Neg Hx    Esophageal cancer Neg Hx    Rectal cancer Neg Hx    Stomach cancer Neg Hx     Social History Social History   Tobacco Use   Smoking status: Never   Smokeless tobacco: Never  Vaping Use   Vaping status: Never Used  Substance Use Topics   Alcohol use: Never    Alcohol/week: 0.0 standard drinks of alcohol   Drug use: Never     Allergies   Codeine   Review of Systems Review of Systems  Constitutional: Negative.   HENT: Negative.    Respiratory: Negative.    Cardiovascular: Negative.   Gastrointestinal:  Positive for abdominal pain.  Musculoskeletal: Negative.   Neurological:  Positive for headaches.  Psychiatric/Behavioral: Negative.       Physical Exam Triage Vital Signs ED Triage Vitals  Encounter Vitals Group     BP 03/08/24 1502 132/79     Systolic BP Percentile --      Diastolic BP Percentile --      Pulse Rate 03/08/24 1502 88     Resp 03/08/24 1502 18     Temp 03/08/24 1502 98.6 F (37 C)     Temp Source 03/08/24 1502 Oral     SpO2 03/08/24 1502 96 %     Weight 03/08/24 1459 150 lb (68 kg)     Height 03/08/24 1459 5\' 1"  (1.549 m)     Head Circumference --      Peak Flow --      Pain Score 03/08/24 1455 6     Pain Loc --      Pain Education --      Exclude from Growth Chart --    No data found.  Updated Vital Signs BP 132/79 (BP Location: Left Arm)   Pulse 88   Temp 98.6 F (37 C) (Oral)   Resp 18   Ht 5\' 1"  (1.549 m)   Wt 68 kg   SpO2 96%   BMI 28.34 kg/m   Visual Acuity Right Eye Distance:   Left Eye Distance:   Bilateral Distance:    Right Eye Near:   Left Eye Near:    Bilateral Near:     Physical Exam Constitutional:      General: She is not in acute distress.    Appearance: She is well-developed and normal weight. She is not ill-appearing or toxic-appearing.  Cardiovascular:     Rate and Rhythm: Normal rate  and regular rhythm.     Heart sounds: Normal heart sounds.  Pulmonary:     Effort: Pulmonary effort is normal.     Breath sounds: Normal breath sounds.  Abdominal:     General: Abdomen is flat. Bowel sounds are normal.     Palpations: Abdomen is soft.     Tenderness: There is no abdominal tenderness.  Skin:    General: Skin is warm.  Neurological:     General: No focal deficit present.     Mental Status: She is alert.  Psychiatric:        Mood and Affect: Mood normal.      UC Treatments / Results  Labs (all labs ordered are listed, but only abnormal results are displayed) Labs Reviewed - No data to display  EKG   Radiology No results found.  Procedures Procedures (including critical care time)  Medications Ordered in UC Medications  alum & mag hydroxide-simeth (MAALOX/MYLANTA) 200-200-20 MG/5ML suspension 30 mL (30 mLs Oral Given 03/08/24 1530)  lidocaine (XYLOCAINE) 2 % viscous mouth solution 15 mL (15 mLs Mouth/Throat Given 03/08/24 1531)    Initial Impression / Assessment and Plan / UC Course  I have reviewed the triage vital signs and the nursing notes.  Pertinent labs & imaging results that were available during my care of the patient were reviewed by me and considered in my medical decision making (see chart for details).   Final Clinical Impressions(s) / UC Diagnoses   Final diagnoses:  Abdominal pain, unspecified abdominal location     Discharge Instructions      You were seen today for abdominal pain.  Your xray was normal today.  If the radiologist reads this differently we will call to notify you.  In the mean time,  I have given you medication today to help with your symptoms.  You may trial over the counter omeprazole, as well as gas-x for your symptoms.  If your pain worsens, or you develop nausea, vomiting, fever/chills then please go to the Emergency Room for your symptoms.      ED Prescriptions   None    PDMP not reviewed this  encounter.   Vanessa Franklin, MD 03/08/24 1558

## 2024-03-08 NOTE — ED Triage Notes (Signed)
"  This started Saturday night with Abd pain (dull ache) then Sunday better by that night the stomach ache got worse with ha, not sleeping at all last night (more severe, no sharp pain). No urinary frequency/urgency or dysuria. Stools "watery episode this morning" (not dark in color). No nausea or vomiting. "The last few wks I have felt bloated".

## 2024-03-08 NOTE — Discharge Instructions (Signed)
 You were seen today for abdominal pain.  Your xray was normal today.  If the radiologist reads this differently we will call to notify you.  In the mean time, I have given you medication today to help with your symptoms.  You may trial over the counter omeprazole, as well as gas-x for your symptoms.  If your pain worsens, or you develop nausea, vomiting, fever/chills then please go to the Emergency Room for your symptoms.

## 2024-06-10 ENCOUNTER — Ambulatory Visit: Payer: Self-pay | Admitting: Emergency Medicine

## 2024-06-10 ENCOUNTER — Encounter: Payer: Self-pay | Admitting: Emergency Medicine

## 2024-06-10 ENCOUNTER — Ambulatory Visit: Payer: Commercial Managed Care - PPO | Admitting: Emergency Medicine

## 2024-06-10 VITALS — BP 128/78 | HR 74 | Temp 98.4°F | Ht 61.0 in | Wt 152.0 lb

## 2024-06-10 DIAGNOSIS — Z1329 Encounter for screening for other suspected endocrine disorder: Secondary | ICD-10-CM

## 2024-06-10 DIAGNOSIS — Z13220 Encounter for screening for lipoid disorders: Secondary | ICD-10-CM

## 2024-06-10 DIAGNOSIS — Z130 Encounter for screening for diseases of the blood and blood-forming organs and certain disorders involving the immune mechanism: Secondary | ICD-10-CM

## 2024-06-10 DIAGNOSIS — L989 Disorder of the skin and subcutaneous tissue, unspecified: Secondary | ICD-10-CM

## 2024-06-10 DIAGNOSIS — Z13228 Encounter for screening for other metabolic disorders: Secondary | ICD-10-CM

## 2024-06-10 DIAGNOSIS — Z0000 Encounter for general adult medical examination without abnormal findings: Secondary | ICD-10-CM | POA: Diagnosis not present

## 2024-06-10 LAB — LIPID PANEL
Cholesterol: 241 mg/dL — ABNORMAL HIGH (ref 0–200)
HDL: 38.2 mg/dL — ABNORMAL LOW (ref >39.00)
LDL Cholesterol: 153 mg/dL — ABNORMAL HIGH (ref 0–99)
NonHDL: 202.53
Total CHOL/HDL Ratio: 6
Triglycerides: 249 mg/dL — ABNORMAL HIGH (ref 0.0–149.0)
VLDL: 49.8 mg/dL — ABNORMAL HIGH (ref 0.0–40.0)

## 2024-06-10 LAB — COMPREHENSIVE METABOLIC PANEL WITH GFR
ALT: 27 U/L (ref 0–35)
AST: 24 U/L (ref 0–37)
Albumin: 4.6 g/dL (ref 3.5–5.2)
Alkaline Phosphatase: 98 U/L (ref 39–117)
BUN: 10 mg/dL (ref 6–23)
CO2: 30 meq/L (ref 19–32)
Calcium: 9.4 mg/dL (ref 8.4–10.5)
Chloride: 103 meq/L (ref 96–112)
Creatinine, Ser: 0.75 mg/dL (ref 0.40–1.20)
GFR: 84.77 mL/min (ref >60.00)
Glucose, Bld: 106 mg/dL — ABNORMAL HIGH (ref 70–99)
Potassium: 4.5 meq/L (ref 3.5–5.1)
Sodium: 139 meq/L (ref 135–145)
Total Bilirubin: 0.6 mg/dL (ref 0.2–1.2)
Total Protein: 7.6 g/dL (ref 6.0–8.3)

## 2024-06-10 LAB — CBC WITH DIFFERENTIAL/PLATELET
Basophils Absolute: 0 K/uL (ref 0.0–0.1)
Basophils Relative: 0.6 % (ref 0.0–3.0)
Eosinophils Absolute: 0.2 K/uL (ref 0.0–0.7)
Eosinophils Relative: 2.7 % (ref 0.0–5.0)
HCT: 38.6 % (ref 36.0–46.0)
Hemoglobin: 12.3 g/dL (ref 12.0–15.0)
Lymphocytes Relative: 44.1 % (ref 12.0–46.0)
Lymphs Abs: 2.9 K/uL (ref 0.7–4.0)
MCHC: 31.8 g/dL (ref 30.0–36.0)
MCV: 66.7 fl — ABNORMAL LOW (ref 78.0–100.0)
Monocytes Absolute: 0.4 K/uL (ref 0.1–1.0)
Monocytes Relative: 5.8 % (ref 3.0–12.0)
Neutro Abs: 3.1 K/uL (ref 1.4–7.7)
Neutrophils Relative %: 46.8 % (ref 43.0–77.0)
Platelets: 203 K/uL (ref 150.0–400.0)
RBC: 5.8 Mil/uL — ABNORMAL HIGH (ref 3.87–5.11)
RDW: 15.4 % (ref 11.5–15.5)
WBC: 6.7 K/uL (ref 4.0–10.5)

## 2024-06-10 LAB — HEMOGLOBIN A1C: Hgb A1c MFr Bld: 5.9 % (ref 4.6–6.5)

## 2024-06-10 NOTE — Progress Notes (Signed)
 Vanessa Nixon 63 y.o.   Chief Complaint  Patient presents with   Annual Exam    Patient here for physical. No other concerns    HISTORY OF PRESENT ILLNESS: This is a 63 y.o. female here for annual exam. Requesting referral to dermatology Requesting referral for calcium  scoring cardiac CT Overall doing well. Has no complaints or medical concerns today.  HPI   Prior to Admission medications   Medication Sig Start Date End Date Taking? Authorizing Provider  UNABLE TO FIND Med Name: apple cider gummie, multivitamin, morning kick suppliment. Patient not taking: Reported on 06/10/2024    [provider]    Allergies  Allergen Reactions   Codeine Itching    Patient Active Problem List   Diagnosis Date Noted   Dyslipidemia 05/04/2020    Past Medical History:  Diagnosis Date   Allergy    Anemia    Arthritis     Past Surgical History:  Procedure Laterality Date   CESAREAN SECTION     COLONOSCOPY     30 plus years ago   WISDOM TOOTH EXTRACTION      Social History   Socioeconomic History   Marital status: Married    Spouse name: Not on file   Number of children: 5   Years of education: Not on file   Highest education level: Bachelor's degree (e.g., BA, AB, BS)  Occupational History   Not on file  Tobacco Use   Smoking status: Never   Smokeless tobacco: Never  Vaping Use   Vaping status: Never Used  Substance and Sexual Activity   Alcohol use: Never    Alcohol/week: 0.0 standard drinks of alcohol   Drug use: Never   Sexual activity: Yes    Partners: Male    Birth control/protection: Condom    Comment: with monogamous husband-1st intercourse 19 yo-Fewer than 5 partners  Other Topics Concern   Not on file  Social History Narrative   Pt is from [place]. Works at [employer] with [employer]. Lives at home with husband and 4 of 5 children.    Social Drivers of Corporate investment banker Strain: Low Risk  (05/18/2018)   Overall Financial  Resource Strain (CARDIA)    Difficulty of Paying Living Expenses: Not hard at all  Food Insecurity: No Food Insecurity (05/18/2018)   Hunger Vital Sign    Worried About Running Out of Food in the Last Year: Never true    Ran Out of Food in the Last Year: Never true  Transportation Needs: No Transportation Needs (05/18/2018)   PRAPARE - Administrator, Civil Service (Medical): No    Lack of Transportation (Non-Medical): No  Physical Activity: Inactive (05/18/2018)   Exercise Vital Sign    Days of Exercise per Week: 0 days    Minutes of Exercise per Session: 0 min  Stress: No Stress Concern Present (05/18/2018)   [employer] of Occupational Health - Occupational Stress Questionnaire    Feeling of Stress : Only a little  Social Connections: Socially Integrated (05/18/2018)   Social Connection and Isolation Panel    Frequency of Communication with Friends and Family: More than three times a week    Frequency of Social Gatherings with Friends and Family: Once a week    Attends Religious Services: More than 4 times per year    Active Member of [employer] or Organizations: Yes    Attends Banker Meetings: More than 4 times per year  Marital Status: Married  Catering manager Violence: Not At Risk (05/18/2018)   Humiliation, Afraid, Rape, and Kick questionnaire    Fear of Current or Ex-Partner: No    Emotionally Abused: No    Physically Abused: No    Sexually Abused: No    Family History  Problem Relation Age of Onset   COPD Mother    Arthritis Mother    Breast cancer Mother 71   Alzheimer's disease Father    Cancer Father        Prostate,Multiple myloma   Thyroid  disease Father    Cancer Sister        Liver   Breast cancer Sister 26   Colon cancer Paternal Aunt    Colon polyps Neg Hx    Esophageal cancer Neg Hx    Rectal cancer Neg Hx    Stomach cancer Neg Hx      Review of Systems  Constitutional: Negative.  Negative for chills and fever.  HENT:  Negative.  Negative for congestion and sore throat.   Respiratory: Negative.  Negative for cough and shortness of breath.   Cardiovascular: Negative.  Negative for chest pain and palpitations.  Gastrointestinal:  Negative for abdominal pain, diarrhea, nausea and vomiting.  Genitourinary: Negative.  Negative for dysuria and hematuria.  Skin: Negative.  Negative for rash.  Neurological: Negative.  Negative for dizziness and headaches.  All other systems reviewed and are negative.   Vitals:   06/10/24 0755  BP: 128/78  Pulse: 74  Temp: 98.4 F (36.9 C)  SpO2: 99%    Physical Exam Vitals reviewed.  Constitutional:      Appearance: Normal appearance.  HENT:     Head: Normocephalic.     Right Ear: Tympanic membrane, ear canal and external ear normal.     Left Ear: Tympanic membrane, ear canal and external ear normal.     Mouth/Throat:     Mouth: Mucous membranes are moist.     Pharynx: Oropharynx is clear.  Eyes:     Extraocular Movements: Extraocular movements intact.     Conjunctiva/sclera: Conjunctivae normal.     Pupils: Pupils are equal, round, and reactive to light.  Cardiovascular:     Rate and Rhythm: Normal rate and regular rhythm.     Pulses: Normal pulses.     Heart sounds: Normal heart sounds.  Pulmonary:     Effort: Pulmonary effort is normal.     Breath sounds: Normal breath sounds.  Abdominal:     Palpations: Abdomen is soft.     Tenderness: There is no abdominal tenderness.  Musculoskeletal:     Cervical back: No tenderness.  Lymphadenopathy:     Cervical: No cervical adenopathy.  Skin:    General: Skin is warm and dry.     Capillary Refill: Capillary refill takes less than 2 seconds.  Neurological:     General: No focal deficit present.     Mental Status: She is alert and oriented to person, place, and time.  Psychiatric:        Mood and Affect: Mood normal.        Behavior: Behavior normal.      ASSESSMENT & PLAN: Problem List Items Addressed  This Visit   None Visit Diagnoses       Routine general medical examination at a health care facility    -  Primary   Relevant Orders   Comprehensive metabolic panel with GFR   CBC with Differential/Platelet   Hemoglobin A1c  Lipid panel   CT CARDIAC SCORING (SELF PAY ONLY)     Screening for deficiency anemia       Relevant Orders   CBC with Differential/Platelet     Screening for lipoid disorders       Relevant Orders   Lipid panel     Screening for endocrine, metabolic and immunity disorder       Relevant Orders   Comprehensive metabolic panel with GFR   Hemoglobin A1c     Skin lesions       Relevant Orders   Ambulatory referral to Dermatology     Modifiable risk factors discussed with patient. Anticipatory guidance according to age provided. The following topics were also discussed: Social Determinants of Health Smoking.  Non-smoker Diet and nutrition Benefits of exercise Cancer screening and review of most recent mammogram and colonoscopy reports Vaccinations review and recommendations Cardiovascular risk assessment and need for blood work The 10-year ASCVD risk score (Arnett DK, et al., 2019) is: 6.4%   Values used to calculate the score:     Age: 26 years     Clincally relevant sex: Female     Is Non-Hispanic African American: No     Diabetic: No     Tobacco smoker: No     Systolic Blood Pressure: 128 mmHg     Is BP treated: No     HDL Cholesterol: 38.2 mg/dL     Total Cholesterol: 258 mg/dL  Mental health including depression and anxiety Fall and accident prevention  Patient Instructions  Health Maintenance, Female Adopting a healthy lifestyle and getting preventive care are important in promoting health and wellness. Ask your health care provider about: The right schedule for you to have regular tests and exams. Things you can do on your own to prevent diseases and keep yourself healthy. What should I know about diet, weight, and exercise? Eat a  healthy diet  Eat a diet that includes plenty of vegetables, fruits, low-fat dairy products, and lean protein. Do not eat a lot of foods that are high in solid fats, added sugars, or sodium. Maintain a healthy weight Body mass index (BMI) is used to identify weight problems. It estimates body fat based on height and weight. Your health care provider can help determine your BMI and help you achieve or maintain a healthy weight. Get regular exercise Get regular exercise. This is one of the most important things you can do for your health. Most adults should: Exercise for at least 150 minutes each week. The exercise should increase your heart rate and make you sweat (moderate-intensity exercise). Do strengthening exercises at least twice a week. This is in addition to the moderate-intensity exercise. Spend less time sitting. Even light physical activity can be beneficial. Watch cholesterol and blood lipids Have your blood tested for lipids and cholesterol at 63 years of age, then have this test every 5 years. Have your cholesterol levels checked more often if: Your lipid or cholesterol levels are high. You are older than 64 years of age. You are at high risk for heart disease. What should I know about cancer screening? Depending on your health history and family history, you may need to have cancer screening at various ages. This may include screening for: Breast cancer. Cervical cancer. Colorectal cancer. Skin cancer. Lung cancer. What should I know about heart disease, diabetes, and high blood pressure? Blood pressure and heart disease High blood pressure causes heart disease and increases the risk of stroke. This is more  likely to develop in people who have high blood pressure readings or are overweight. Have your blood pressure checked: Every 3-5 years if you are 67-72 years of age. Every year if you are 53 years old or older. Diabetes Have regular diabetes screenings. This checks  your fasting blood sugar level. Have the screening done: Once every three years after age 1 if you are at a normal weight and have a low risk for diabetes. More often and at a younger age if you are overweight or have a high risk for diabetes. What should I know about preventing infection? Hepatitis B If you have a higher risk for hepatitis B, you should be screened for this virus. Talk with your health care provider to find out if you are at risk for hepatitis B infection. Hepatitis C Testing is recommended for: Everyone born from [birth year] through 1965. Anyone with known risk factors for hepatitis C. Sexually transmitted infections (STIs) Get screened for STIs, including gonorrhea and chlamydia, if: You are sexually active and are younger than 63 years of age. You are older than 63 years of age and your health care provider tells you that you are at risk for this type of infection. Your sexual activity has changed since you were last screened, and you are at increased risk for chlamydia or gonorrhea. Ask your health care provider if you are at risk. Ask your health care provider about whether you are at high risk for HIV. Your health care provider may recommend a prescription medicine to help prevent HIV infection. If you choose to take medicine to prevent HIV, you should first get tested for HIV. You should then be tested every 3 months for as long as you are taking the medicine. Pregnancy If you are about to stop having your period (premenopausal) and you may become pregnant, seek counseling before you get pregnant. Take 400 to 800 micrograms (mcg) of folic acid every day if you become pregnant. Ask for birth control (contraception) if you want to prevent pregnancy. Osteoporosis and menopause Osteoporosis is a disease in which the bones lose minerals and strength with aging. This can result in bone fractures. If you are 16 years old or older, or if you are at risk for osteoporosis and fractures,  ask your health care provider if you should: Be screened for bone loss. Take a calcium  or vitamin D supplement to lower your risk of fractures. Be given hormone replacement therapy (HRT) to treat symptoms of menopause. Follow these instructions at home: Alcohol use Do not drink alcohol if: Your health care provider tells you not to drink. You are pregnant, may be pregnant, or are planning to become pregnant. If you drink alcohol: Limit how much you have to: 0-1 drink a day. Know how much alcohol is in your drink. In the [place] one drink equals one 12 oz bottle of beer (355 mL), one 5 oz glass of wine (148 mL), or one 1 oz glass of hard liquor (44 mL). Lifestyle Do not use any products that contain nicotine or tobacco. These products include cigarettes, chewing tobacco, and vaping devices, such as e-cigarettes. If you need help quitting, ask your health care provider. Do not use street drugs. Do not share needles. Ask your health care provider for help if you need support or information about quitting drugs. General instructions Schedule regular health, dental, and eye exams. Stay current with your vaccines. Tell your health care provider if: You often feel depressed. You have ever been abused  or do not feel safe at home. Summary Adopting a healthy lifestyle and getting preventive care are important in promoting health and wellness. Follow your health care provider's instructions about healthy diet, exercising, and getting tested or screened for diseases. Follow your health care provider's instructions on monitoring your cholesterol and blood pressure. This information is not intended to replace advice given to you by your health care provider. Make sure you discuss any questions you have with your health care provider. Document Revised: 04/09/2021 Document Reviewed: 04/09/2021 Elsevier Patient Education  2024 Elsevier Inc.     Emil Schaumann, MD [facility]

## 2024-06-10 NOTE — Patient Instructions (Signed)

## 2024-07-13 DIAGNOSIS — M9905 Segmental and somatic dysfunction of pelvic region: Secondary | ICD-10-CM | POA: Diagnosis not present

## 2024-07-13 DIAGNOSIS — M5116 Intervertebral disc disorders with radiculopathy, lumbar region: Secondary | ICD-10-CM | POA: Diagnosis not present

## 2024-07-13 DIAGNOSIS — M9903 Segmental and somatic dysfunction of lumbar region: Secondary | ICD-10-CM | POA: Diagnosis not present

## 2024-07-13 DIAGNOSIS — M25552 Pain in left hip: Secondary | ICD-10-CM | POA: Diagnosis not present

## 2024-07-14 DIAGNOSIS — M9903 Segmental and somatic dysfunction of lumbar region: Secondary | ICD-10-CM | POA: Diagnosis not present

## 2024-07-14 DIAGNOSIS — M25552 Pain in left hip: Secondary | ICD-10-CM | POA: Diagnosis not present

## 2024-07-14 DIAGNOSIS — M5116 Intervertebral disc disorders with radiculopathy, lumbar region: Secondary | ICD-10-CM | POA: Diagnosis not present

## 2024-07-14 DIAGNOSIS — M9905 Segmental and somatic dysfunction of pelvic region: Secondary | ICD-10-CM | POA: Diagnosis not present

## 2024-07-19 DIAGNOSIS — M9903 Segmental and somatic dysfunction of lumbar region: Secondary | ICD-10-CM | POA: Diagnosis not present

## 2024-07-19 DIAGNOSIS — M9905 Segmental and somatic dysfunction of pelvic region: Secondary | ICD-10-CM | POA: Diagnosis not present

## 2024-07-19 DIAGNOSIS — M25552 Pain in left hip: Secondary | ICD-10-CM | POA: Diagnosis not present

## 2024-07-19 DIAGNOSIS — M5116 Intervertebral disc disorders with radiculopathy, lumbar region: Secondary | ICD-10-CM | POA: Diagnosis not present

## 2024-07-21 DIAGNOSIS — M5116 Intervertebral disc disorders with radiculopathy, lumbar region: Secondary | ICD-10-CM | POA: Diagnosis not present

## 2024-07-21 DIAGNOSIS — M9903 Segmental and somatic dysfunction of lumbar region: Secondary | ICD-10-CM | POA: Diagnosis not present

## 2024-07-21 DIAGNOSIS — M9905 Segmental and somatic dysfunction of pelvic region: Secondary | ICD-10-CM | POA: Diagnosis not present

## 2024-07-21 DIAGNOSIS — Z1231 Encounter for screening mammogram for malignant neoplasm of breast: Secondary | ICD-10-CM | POA: Diagnosis not present

## 2024-07-21 DIAGNOSIS — M25552 Pain in left hip: Secondary | ICD-10-CM | POA: Diagnosis not present

## 2024-07-21 LAB — HM MAMMOGRAPHY

## 2024-07-23 ENCOUNTER — Ambulatory Visit: Payer: Self-pay | Admitting: Emergency Medicine

## 2024-07-23 ENCOUNTER — Encounter: Payer: Self-pay | Admitting: Emergency Medicine

## 2024-08-16 ENCOUNTER — Other Ambulatory Visit (HOSPITAL_BASED_OUTPATIENT_CLINIC_OR_DEPARTMENT_OTHER)

## 2024-08-19 ENCOUNTER — Ambulatory Visit (HOSPITAL_BASED_OUTPATIENT_CLINIC_OR_DEPARTMENT_OTHER)
Admission: RE | Admit: 2024-08-19 | Discharge: 2024-08-19 | Disposition: A | Discharge status: Home / Self Care | Payer: Self-pay | Source: Ambulatory Visit | Attending: Emergency Medicine | Admitting: Emergency Medicine

## 2024-08-19 DIAGNOSIS — Z0000 Encounter for general adult medical examination without abnormal findings: Secondary | ICD-10-CM | POA: Insufficient documentation

## 2024-09-20 ENCOUNTER — Encounter: Payer: Self-pay | Admitting: Gastroenterology

## 2024-10-08 ENCOUNTER — Encounter

## 2024-10-11 ENCOUNTER — Ambulatory Visit: Admitting: *Deleted

## 2024-10-11 ENCOUNTER — Encounter

## 2024-10-11 ENCOUNTER — Encounter: Payer: Self-pay | Admitting: Gastroenterology

## 2024-10-11 VITALS — Ht 61.0 in | Wt 150.0 lb

## 2024-10-11 DIAGNOSIS — Z800 Family history of malignant neoplasm of digestive organs: Secondary | ICD-10-CM

## 2024-10-11 DIAGNOSIS — Z860100 Personal history of colon polyps, unspecified: Secondary | ICD-10-CM

## 2024-10-11 MED ORDER — NA SULFATE-K SULFATE-MG SULF 17.5-3.13-1.6 GM/177ML PO SOLN: 1.0000 | Freq: Once | ORAL | 0 refills | Status: AC

## 2024-10-11 NOTE — Progress Notes (Signed)
 Pt's name and DOB verified at the beginning of the pre-visit with 2 identifiers  Pt denies any difficulty with ambulating,sitting, laying down or rolling side to side  Pt has no issues moving head neck or swallowing  No egg or soy allergy known to patient   No issues known to pt with past sedation  No FH of Malignant Hyperthermia  Pt is not on home 02   Pt is not on blood thinners   Pt denies issues with constipation   Pt has frequent issues with constipation RN instructed pt to use Miralax per bottles instructions a week before prep days. Pt states they will  Pt is not on dialysis  Pt denise any abnormal heart rhythms   Pt denies any upcoming cardiac testing  Patient's chart reviewed by Norleen Schillings CNRA prior to pre-visit and patient appropriate for the [facility].  Pre-visit completed and red dot placed by patient's name on their procedure day (on provider's schedule).    Visit by phone  Pt states weight is 150 lb  Pt given  both [facility] main # and MD on call # prior to instructions.  Informed pt to come in at the time discussed and is shown on PV instructions.  Pt instructed to use Singlecare.com or GoodRx for a price reduction on prep  Instructed pt where to find PV instructions in My Ch. Copy of instructions  to be sent in mail and address read back to pt to verify correct on envelope. Instructed pt on all aspects of written instructions including med holds clothing to wear and foods to eat and not eat as well as after procedure legal restrictions and to call MD on call if needed.. Pt states understanding. Instructed pt to review instructions again prior to procedure and call main # given if has any questions or any issues. Pt states they will.

## 2024-10-11 NOTE — Addendum Note (Signed)
 Addended by: MALINDA ORIE CROME on: 10/11/2024 04:20 PM   Modules accepted: Orders

## 2024-10-22 ENCOUNTER — Ambulatory Visit: Admitting: Gastroenterology

## 2024-10-22 ENCOUNTER — Encounter: Payer: Self-pay | Admitting: Gastroenterology

## 2024-10-22 VITALS — BP 142/73 | HR 61 | Temp 97.5°F | Resp 15 | Ht 61.0 in | Wt 150.0 lb

## 2024-10-22 DIAGNOSIS — K648 Other hemorrhoids: Secondary | ICD-10-CM

## 2024-10-22 DIAGNOSIS — Z860101 Personal history of adenomatous and serrated colon polyps: Secondary | ICD-10-CM | POA: Diagnosis not present

## 2024-10-22 DIAGNOSIS — Z1211 Encounter for screening for malignant neoplasm of colon: Secondary | ICD-10-CM | POA: Diagnosis not present

## 2024-10-22 DIAGNOSIS — Z800 Family history of malignant neoplasm of digestive organs: Secondary | ICD-10-CM | POA: Diagnosis not present

## 2024-10-22 DIAGNOSIS — Z860100 Personal history of colon polyps, unspecified: Secondary | ICD-10-CM

## 2024-10-22 DIAGNOSIS — K644 Residual hemorrhoidal skin tags: Secondary | ICD-10-CM | POA: Diagnosis not present

## 2024-10-22 MED ORDER — SODIUM CHLORIDE 0.9 % IV SOLN: 500.0000 mL | Freq: Once | INTRAVENOUS | Status: DC

## 2024-10-22 NOTE — Op Note (Signed)
 [facility] Patient Name: Vanessa Nixon Procedure Date: 10/22/2024 11:34 AM MRN: [id number] Endoscopist: Gustav ALONSO Mcgee , MD, [id number] Age: 63 Referring MD:  Date of Birth: [birth year] Gender: Female Account #: [id number] Procedure:                Colonoscopy Indications:              High risk colon cancer surveillance: Personal                            history of colonic polyps, High risk colon cancer                            surveillance: Personal history of sessile serrated                            colon polyp (less than 10 mm in size) with no                            dysplasia, Screening for colon cancer: Family                            history of colorectal cancer in multiple 2nd degree                            relatives Medicines:                Monitored Anesthesia Care Procedure:                Pre-Anesthesia Assessment:                           - Prior to the procedure, a History and Physical                            was performed, and patient medications and                            allergies were reviewed. The patient's tolerance of                            previous anesthesia was also reviewed. The risks                            and benefits of the procedure and the sedation                            options and risks were discussed with the patient.                            All questions were answered, and informed consent                            was obtained. Prior Anticoagulants: The patient has  taken no anticoagulant or antiplatelet agents. ASA                            Grade Assessment: II - A patient with mild systemic                            disease. After reviewing the risks and benefits,                            the patient was deemed in satisfactory condition to                            undergo the procedure.                           After obtaining informed consent, the colonoscope                             was passed under direct vision. Throughout the                            procedure, the patient's blood pressure, pulse, and                            oxygen saturations were monitored continuously. The                            [id number]-HQ190L Colonoscope [id number] was introduced                            through the anus and advanced to the the cecum,                            identified by appendiceal orifice and ileocecal                            valve. The colonoscopy was performed without                            difficulty. The patient tolerated the procedure                            well. The quality of the bowel preparation was                            good. The ileocecal valve, appendiceal orifice, and                            rectum were photographed. Scope In: 11:41:57 AM Scope Out: 11:54:58 AM Scope Withdrawal Time: 0 hours 7 minutes 10 seconds  Total Procedure Duration: 0 hours 13 minutes 1 second  Findings:                 The perianal and digital rectal examinations were  normal.                           Non-bleeding external and internal hemorrhoids were                            found during retroflexion. The hemorrhoids were                            medium-sized.                           The exam was otherwise without abnormality. Complications:            No immediate complications. Estimated Blood Loss:     Estimated blood loss: none. Impression:               - Non-bleeding external and internal hemorrhoids.                           - The examination was otherwise normal.                           - No specimens collected. Recommendation:           - Resume previous diet.                           - Continue present medications.                           - Repeat colonoscopy in 5 years for surveillance. Tamaka Sawin V. Crecencio Kwiatek, MD 10/22/2024 12:00:27 PM This report has been signed electronically.

## 2024-10-22 NOTE — Progress Notes (Signed)
 Transferred to PACU via stretcher.  Not responding to stimulation at this time.  VSS upon leaving procedure room.

## 2024-10-22 NOTE — Progress Notes (Signed)
Pt. states no medical or surgical changes since previsit or office visit. 

## 2024-10-22 NOTE — Patient Instructions (Signed)
  Resume previous diet. Continue present medications. Repeat colonoscopy in 5 years for surveillance   YOU HAD AN ENDOSCOPIC PROCEDURE TODAY AT [facility]:   Refer to the procedure report that was given to you for any specific questions about what was found during the examination.  If the procedure report does not answer your questions, please call your gastroenterologist to clarify.  If you requested that your care partner not be given the details of your procedure findings, then the procedure report has been included in a sealed envelope for you to review at your convenience later.  YOU SHOULD EXPECT: Some feelings of bloating in the abdomen. Passage of more gas than usual.  Walking can help get rid of the air that was put into your GI tract during the procedure and reduce the bloating. If you had a lower endoscopy (such as a colonoscopy or flexible sigmoidoscopy) you may notice spotting of blood in your stool or on the toilet paper. If you underwent a bowel prep for your procedure, you may not have a normal bowel movement for a few days.  Please Note:  You might notice some irritation and congestion in your nose or some drainage.  This is from the oxygen used during your procedure.  There is no need for concern and it should clear up in a day or so.  SYMPTOMS TO REPORT IMMEDIATELY:  Following lower endoscopy (colonoscopy or flexible sigmoidoscopy):  Excessive amounts of blood in the stool  Significant tenderness or worsening of abdominal pains  Swelling of the abdomen that is new, acute  Fever of 100F or higher  For urgent or emergent issues, a gastroenterologist can be reached at any hour by calling [phone]. Do not use MyChart messaging for urgent concerns.    DIET:  We do recommend a small meal at first, but then you may proceed to your regular diet.  Drink plenty of fluids but you should avoid alcoholic beverages for 24 hours.  ACTIVITY:  You should plan to  take it easy for the rest of today and you should NOT DRIVE or use heavy machinery until tomorrow (because of the sedation medicines used during the test).    FOLLOW UP: Our staff will call the number listed on your records the next business day following your procedure.  We will call around 7:15- 8:00 am to check on you and address any questions or concerns that you may have regarding the information given to you following your procedure. If we do not reach you, we will leave a message.     If any biopsies were taken you will be contacted by phone or by letter within the next 1-3 weeks.  Please call us at [phone] if you have not heard about the biopsies in 3 weeks.    SIGNATURES/CONFIDENTIALITY: You and/or your care partner have signed paperwork which will be entered into your electronic medical record.  These signatures attest to the fact that that the information above on your After Visit Summary has been reviewed and is understood.  Full responsibility of the confidentiality of this discharge information lies with you and/or your care-partner.

## 2024-10-22 NOTE — Progress Notes (Signed)
 [facility] History and Physical   Primary Care Physician:  Purcell Emil Schanz, MD   Reason for Procedure:  History of adenomatous colon polyps, family h/o colon cancer  Plan:    Surveillance colonoscopy with possible interventions as needed     HPI: Vanessa Nixon is a very pleasant 63 y.o. female here for surveillance colonoscopy. Denies any nausea, vomiting, abdominal pain, melena or bright red blood per rectum  The risks and benefits as well as alternatives of endoscopic procedure(s) have been discussed and reviewed.  The patient was provided an opportunity to ask questions and all were answered. The patient agreed with the plan and demonstrated an understanding of the instructions.   Past Medical History:  Diagnosis Date   Allergy    Anemia    Arthritis     Past Surgical History:  Procedure Laterality Date   CESAREAN SECTION     COLONOSCOPY     30 plus years ago   WISDOM TOOTH EXTRACTION      Prior to Admission medications   Medication Sig Start Date End Date Taking? Authorizing Provider  UNABLE TO FIND Med Name: apple cider gummie, multivitamin, morning kick suppliment. Patient not taking: Reported on 10/11/2024    [provider]    Current Outpatient Medications  Medication Sig Dispense Refill   UNABLE TO FIND Med Name: apple cider gummie, multivitamin, morning kick suppliment. (Patient not taking: Reported on 10/11/2024)     Current Facility-Administered Medications  Medication Dose Route Frequency Provider Last Rate Last Admin   0.9 %  sodium chloride  infusion  500 mL Intravenous Once Malaiyah Achorn V, MD        Allergies as of 10/22/2024 - Review Complete 10/22/2024  Allergen Reaction Noted   Codeine Itching 01/29/2016    Family History  Problem Relation Age of Onset   COPD Mother    Arthritis Mother    Breast cancer Mother 26   Alzheimer's disease Father    Cancer Father        Prostate,Multiple myloma   Thyroid   disease Father    Cancer Sister        Liver   Breast cancer Sister 62   Colon cancer Paternal Aunt    Colon polyps Neg Hx    Esophageal cancer Neg Hx    Rectal cancer Neg Hx    Stomach cancer Neg Hx     Social History   Socioeconomic History   Marital status: Married    Spouse name: Not on file   Number of children: 5   Years of education: Not on file   Highest education level: Bachelor's degree (e.g., BA, AB, BS)  Occupational History   Not on file  Tobacco Use   Smoking status: Never   Smokeless tobacco: Never  Vaping Use   Vaping status: Never Used  Substance and Sexual Activity   Alcohol use: Never    Alcohol/week: 0.0 standard drinks of alcohol   Drug use: Never   Sexual activity: Yes    Partners: Male    Birth control/protection: Condom    Comment: with monogamous husband-1st intercourse 19 yo-Fewer than 5 partners  Other Topics Concern   Not on file  Social History Narrative   Pt is from [place]. Works at [employer] with [employer]. Lives at home with husband and 4 of 5 children.    Social Drivers of Corporate Investment Banker Strain: Low Risk  (05/18/2018)   Overall Financial Resource Strain (CARDIA)  Difficulty of Paying Living Expenses: Not hard at all  Food Insecurity: No Food Insecurity (05/18/2018)   Hunger Vital Sign    Worried About Running Out of Food in the Last Year: Never true    Ran Out of Food in the Last Year: Never true  Transportation Needs: No Transportation Needs (05/18/2018)   PRAPARE - Administrator, Civil Service (Medical): No    Lack of Transportation (Non-Medical): No  Physical Activity: Inactive (05/18/2018)   Exercise Vital Sign    Days of Exercise per Week: 0 days    Minutes of Exercise per Session: 0 min  Stress: No Stress Concern Present (05/18/2018)   [employer] of Occupational Health - Occupational Stress Questionnaire    Feeling of Stress : Only a little  Social Connections: Socially Integrated  (05/18/2018)   Social Connection and Isolation Panel    Frequency of Communication with Friends and Family: More than three times a week    Frequency of Social Gatherings with Friends and Family: Once a week    Attends Religious Services: More than 4 times per year    Active Member of [employer] or Organizations: Yes    Attends Engineer, Structural: More than 4 times per year    Marital Status: Married  Catering Manager Violence: Not At Risk (05/18/2018)   Humiliation, Afraid, Rape, and Kick questionnaire    Fear of Current or Ex-Partner: No    Emotionally Abused: No    Physically Abused: No    Sexually Abused: No    Review of Systems:  All other review of systems negative except as mentioned in the HPI.  Physical Exam: Vital signs in last 24 hours: BP (!) 150/69   Pulse 66   Temp (!) 97.5 F (36.4 C) (Skin)   Ht 5' 1 (1.549 m)   Wt 150 lb (68 kg)   SpO2 100%   BMI 28.34 kg/m  General:   Alert, NAD Lungs:  Clear .   Heart:  Regular rate and rhythm Abdomen:  Soft, nontender and nondistended. Neuro/Psych:  Alert and cooperative. Normal mood and affect. A and O x 3  Reviewed labs, radiology imaging, old records and pertinent past GI work up  Patient is appropriate for planned procedure(s) and anesthesia in an ambulatory setting   K. Veena Neria Procter , MD [phone]

## 2024-10-25 ENCOUNTER — Telehealth: Payer: Self-pay | Admitting: *Deleted

## 2024-10-25 NOTE — Telephone Encounter (Signed)
  Follow up Call-     10/22/2024   10:18 AM  Call back number  Post procedure Call Back phone  # [phone]  Permission to leave phone message Yes     Patient questions:  Do you have a fever, pain , or abdominal swelling? No. Pain Score  0 *  Have you tolerated food without any problems? Yes.    Have you been able to return to your normal activities? Yes.    Do you have any questions about your discharge instructions: Diet   No. Medications  No. Follow up visit  No.  Do you have questions or concerns about your Care? No.  Actions: * If pain score is 4 or above: No action needed, pain <4.

## 2024-12-13 ENCOUNTER — Ambulatory Visit (HOSPITAL_BASED_OUTPATIENT_CLINIC_OR_DEPARTMENT_OTHER)
Admission: RE | Admit: 2024-12-13 | Discharge: 2024-12-13 | Disposition: A | Discharge status: Home / Self Care | Attending: Family Medicine | Admitting: Family Medicine

## 2024-12-13 ENCOUNTER — Encounter (HOSPITAL_BASED_OUTPATIENT_CLINIC_OR_DEPARTMENT_OTHER): Payer: Self-pay

## 2024-12-13 ENCOUNTER — Other Ambulatory Visit (HOSPITAL_BASED_OUTPATIENT_CLINIC_OR_DEPARTMENT_OTHER): Payer: Self-pay

## 2024-12-13 VITALS — BP 158/82 | HR 85 | Temp 97.9°F | Resp 16

## 2024-12-13 DIAGNOSIS — J029 Acute pharyngitis, unspecified: Secondary | ICD-10-CM | POA: Diagnosis not present

## 2024-12-13 LAB — POCT RAPID STREP A (OFFICE): Rapid Strep A Screen: NEGATIVE

## 2024-12-13 MED ORDER — PREDNISONE 20 MG PO TABS
40.0000 mg | ORAL_TABLET | Freq: Every day | ORAL | 0 refills | Status: AC
  Filled 2024-12-13: qty 10, 5d supply, fill #0

## 2024-12-13 NOTE — ED Triage Notes (Signed)
 Patient presents to the office for sore throat x 3 days. Denies any other symptoms. Has not taken any medication for her sore throat.

## 2024-12-13 NOTE — Discharge Instructions (Addendum)
 Prednisone  as prescribed for sore throat and inflammation along with your tube inflammation.  Recommend over-the-counter allergy medication like Zyrtec.  Continue with other measures like Tylenol and ibuprofen for the pain.  Warm salt water to the throat.  Follow-up as needed

## 2024-12-13 NOTE — ED Provider Notes (Signed)
 " [facility]    CSN: [id number] Arrival date & time: 12/13/24  0843      History   Chief Complaint Chief Complaint  Patient presents with   Sore Throat    Entered by patient    HPI Vanessa Nixon is a 64 y.o. female.   Patient is a 64 year old female that  presents to the office for sore throat x 3 days. Bilateral ear fullness.  Denies any other symptoms. Has not taken any medication for her sore throat. No fever.     Sore Throat    Past Medical History:  Diagnosis Date   Allergy    Anemia    Arthritis     Patient Active Problem List   Diagnosis Date Noted   Dyslipidemia 05/04/2020    Past Surgical History:  Procedure Laterality Date   CESAREAN SECTION     COLONOSCOPY     30 plus years ago   WISDOM TOOTH EXTRACTION      OB History     Gravida  9   Para  5   Term      Preterm      AB  4   Living  5      SAB  2   IAB  2   Ectopic      Multiple      Live Births               Home Medications    Prior to Admission medications  Medication Sig Start Date End Date Taking? Authorizing Provider  predniSONE  (DELTASONE ) 20 MG tablet Take 2 tablets (40 mg total) by mouth daily with breakfast for 5 days. 12/13/24 12/18/24 Yes Anupama Piehl A, FNP  UNABLE TO FIND Med Name: apple cider gummie, multivitamin, morning kick suppliment. Patient not taking: Reported on 10/11/2024    [provider]    Family History Family History  Problem Relation Age of Onset   COPD Mother    Arthritis Mother    Breast cancer Mother 27   Alzheimer's disease Father    Cancer Father        Prostate,Multiple myloma   Thyroid  disease Father    Cancer Sister        Liver   Breast cancer Sister 66   Colon cancer Paternal Aunt    Colon polyps Neg Hx    Esophageal cancer Neg Hx    Rectal cancer Neg Hx    Stomach cancer Neg Hx     Social History Social History[1]   Allergies   Codeine   Review of Systems Review of Systems See  HPI  Physical Exam Triage Vital Signs ED Triage Vitals  Encounter Vitals Group     BP 12/13/24 0858 (!) 158/82     Girls Systolic BP Percentile --      Girls Diastolic BP Percentile --      Boys Systolic BP Percentile --      Boys Diastolic BP Percentile --      Pulse Rate 12/13/24 0858 85     Resp 12/13/24 0858 16     Temp 12/13/24 0858 97.9 F (36.6 C)     Temp Source 12/13/24 0858 Oral     SpO2 12/13/24 0858 98 %     Weight --      Height --      Head Circumference --      Peak Flow --      Pain Score 12/13/24  0902 0     Pain Loc --      Pain Education --      Exclude from Growth Chart --    No data found.  Updated Vital Signs BP (!) 158/82 (BP Location: Left Arm)   Pulse 85   Temp 97.9 F (36.6 C) (Oral)   Resp 16   SpO2 98%   Visual Acuity Right Eye Distance:   Left Eye Distance:   Bilateral Distance:    Right Eye Near:   Left Eye Near:    Bilateral Near:     Physical Exam Vitals and nursing note reviewed.  Constitutional:      General: She is not in acute distress.    Appearance: Normal appearance. She is not ill-appearing, toxic-appearing or diaphoretic.  HENT:     Mouth/Throat:     Pharynx: Posterior oropharyngeal erythema present.     Tonsils: No tonsillar exudate or tonsillar abscesses. 1+ on the right. 1+ on the left.     Comments: PND Pulmonary:     Effort: Pulmonary effort is normal.  Neurological:     Mental Status: She is alert.  Psychiatric:        Mood and Affect: Mood normal.      UC Treatments / Results  Labs (all labs ordered are listed, but only abnormal results are displayed) Labs Reviewed  POCT RAPID STREP A (OFFICE) - Normal    EKG   Radiology No results found.  Procedures Procedures (including critical care time)  Medications Ordered in UC Medications - No data to display  Initial Impression / Assessment and Plan / UC Course  I have reviewed the triage vital signs and the nursing notes.  Pertinent labs &  imaging results that were available during my care of the patient were reviewed by me and considered in my medical decision making (see chart for details).     Sore throat-rapid strep is negative.  Most likely sore throat is from viral illness or postnasal drainage.Prednisone  as prescribed for sore throat and inflammation along with your tube inflammation.  Recommend over-the-counter allergy medication like Zyrtec.  Continue with other measures like Tylenol and ibuprofen for the pain.  Warm salt water to the throat.  Follow-up as needed Final Clinical Impressions(s) / UC Diagnoses   Final diagnoses:  Sore throat     Discharge Instructions      Prednisone  as prescribed for sore throat and inflammation along with your tube inflammation.  Recommend over-the-counter allergy medication like Zyrtec.  Continue with other measures like Tylenol and ibuprofen for the pain.  Warm salt water to the throat.  Follow-up as needed     ED Prescriptions     Medication Sig Dispense Auth. Provider   predniSONE  (DELTASONE ) 20 MG tablet Take 2 tablets (40 mg total) by mouth daily with breakfast for 5 days. 10 tablet Adah Wilbert LABOR, FNP      PDMP not reviewed this encounter.     [1]  Social History Tobacco Use   Smoking status: Never   Smokeless tobacco: Never  Vaping Use   Vaping status: Never Used  Substance Use Topics   Alcohol use: Never    Alcohol/week: 0.0 standard drinks of alcohol   Drug use: Never     Adah Wilbert LABOR, FNP 12/13/24 0926  "

## 2025-01-18 ENCOUNTER — Ambulatory Visit: Admitting: Physician Assistant
